# Patient Record
Sex: Female | Born: 1935 | Race: White | Hispanic: No | Marital: Single | Smoking: Never smoker
Health system: Southern US, Community
[De-identification: ages and names within clinical notes are randomized; demographics above are authoritative.]

## PROBLEM LIST (undated history)

## (undated) DIAGNOSIS — C4490 Unspecified malignant neoplasm of skin, unspecified: Secondary | ICD-10-CM

## (undated) DIAGNOSIS — F319 Bipolar disorder, unspecified: Secondary | ICD-10-CM

## (undated) DIAGNOSIS — R1310 Dysphagia, unspecified: Secondary | ICD-10-CM

## (undated) DIAGNOSIS — H9190 Unspecified hearing loss, unspecified ear: Secondary | ICD-10-CM

## (undated) DIAGNOSIS — F32A Depression, unspecified: Secondary | ICD-10-CM

## (undated) DIAGNOSIS — F329 Major depressive disorder, single episode, unspecified: Secondary | ICD-10-CM

## (undated) DIAGNOSIS — I2510 Atherosclerotic heart disease of native coronary artery without angina pectoris: Secondary | ICD-10-CM

## (undated) DIAGNOSIS — K219 Gastro-esophageal reflux disease without esophagitis: Secondary | ICD-10-CM

## (undated) DIAGNOSIS — I10 Essential (primary) hypertension: Secondary | ICD-10-CM

## (undated) DIAGNOSIS — A0472 Enterocolitis due to Clostridium difficile, not specified as recurrent: Secondary | ICD-10-CM

## (undated) DIAGNOSIS — I639 Cerebral infarction, unspecified: Secondary | ICD-10-CM

## (undated) DIAGNOSIS — E7800 Pure hypercholesterolemia, unspecified: Secondary | ICD-10-CM

## (undated) DIAGNOSIS — N289 Disorder of kidney and ureter, unspecified: Secondary | ICD-10-CM

---

## 2005-10-29 ENCOUNTER — Inpatient Hospital Stay (HOSPITAL_COMMUNITY): Admission: EM | Admit: 2005-10-29 | Discharge: 2005-11-03 | Payer: Self-pay | Admitting: Emergency Medicine

## 2005-10-30 ENCOUNTER — Encounter (INDEPENDENT_AMBULATORY_CARE_PROVIDER_SITE_OTHER): Payer: Self-pay | Admitting: Interventional Cardiology

## 2005-10-30 ENCOUNTER — Encounter: Payer: Self-pay | Admitting: Vascular Surgery

## 2007-04-25 ENCOUNTER — Encounter: Admission: RE | Admit: 2007-04-25 | Discharge: 2007-04-25 | Payer: Self-pay | Admitting: Gastroenterology

## 2008-09-08 ENCOUNTER — Encounter: Admission: RE | Admit: 2008-09-08 | Discharge: 2008-09-08 | Payer: Self-pay | Admitting: Internal Medicine

## 2010-09-30 NOTE — Discharge Summary (Signed)
[name], [name]                  ACCOUNT NO.:  [id number]   MEDICAL RECORD NO.:  [id number]          Haas TYPE:  INP   LOCATION:  4731                         FACILITY:  [facility]   PHYSICIAN:  Lonia Blood, M.D.       DATE OF BIRTH:  10/18/1923   DATE OF ADMISSION:  10/29/2005  DATE OF DISCHARGE:  11/01/2005                                 DISCHARGE SUMMARY   PRIMARY CARE PHYSICIAN:  Unassigned.   DISCHARGE DIAGNOSES:  1.  Acute mental status changes of unclear etiology.  2.  Lacunar cerebrovascular accident.  3.  Hypertension.  4.  Hyperlipidemia.  5.  Extensive atherosclerotic disease of Victoria cerebrovascular system.  6.  Bilateral carotid artery stenosis, 80% bilaterally.  7.  Mild anemia.  8.  Mild cognitive deficit.  9.  Gastroesophageal reflux disease.  10. Coronary artery disease status post angioplasty in 1998.   DISCHARGE MEDICATIONS:  1.  Aricept 5 mg by mouth at bedtime.  2.  Remeron 15 mg by mouth at bedtime.  3.  Aspirin 325 mg by mouth daily.  4.  Zyprexa 2.5 mg at bedtime.  5.  Diovan 80 mg by mouth daily.  6.  Zocor 40 mg by mouth at bedtime.  7.  Prilosec OTC 20 mg by mouth daily.  8.  Foltx 1 tablet daily.   CONDITION AT DISCHARGE:  [name] in good condition.   At Victoria time of discharge, she was alert, oriented, contributing to her care.  She was Haas under Victoria care of her family who has been pursuing short-  term assisted living placement.  [name]'s family was given instructions  about how to contact [facility] office if [name] wishes  to establish care in [place], [place].  [name] is from  [place], [place].   PROCEDURES DURING THIS ADMISSION:  1.  [name] had an MRI of Victoria brain that showed a subacute lacunar CVA.  2.  MRA of Victoria brain that showed extensive atherosclerotic disease.  3.  Transthoracic echocardiogram which showed mild increased thickness and      45%  ejection fraction.  4.  Carotid ultrasound that showed 80% bilateral ICA stenoses.  5.  EEG that showed borderline slow alpha but no seizure activity.   CONSULTATIONS DURING THIS ADMISSION:  [name] was seen in consultation  by Dr. Jeanie Sewer from psychiatry and Dr. Anne Hahn from neurology.   HISTORY AND PHYSICAL:  For admission history and physical, refer to Victoria  dictated H&P done by Dr. Darnelle Catalan on 10/29/05.   HOSPITAL COURSE:  1.  Altered mental status.  [name] was admitted with a workup diagnosis of      delirium.  She had extensive laboratory work including cultures,      complete metabolic profile, complete blood count.  There was no cause      for delirium that we could identify.  [name]'s mental status      cleared remarkably and by hospital day 2, she was alert and oriented,  able to feed herself and cooperating with care.  Victoria history gathered      from Victoria family raised a possibility of a psychiatric illness, so Victoria      Haas was seen in consultation by Dr. Antonietta Breach from psychiatry.      Victoria psychiatry service deemed that [name] may have insidious onset      dementia with a component of a mood disorder, not otherwise specified.      [name] is currently on Aricept, Remeron and Zyprexa and she appears      symptomatically okay.  2.  Lacunar CVA.  [name] had complete workup.  It was felt that her CVA      was related to her hypertensive disease and hyperlipidemia.  We have      proceeded with aggressive risk factor modification, blood pressure      control, started [name] on aspirin, started [name] on a      statin.  Also, [name] was started on folic acid daily.  3.  Hyperlipidemia.  [name] was started on Zocor while here in Victoria      hospital.  4.  Gastroesophageal reflux disease.  [name] PPI was  continued while      she was in Victoria hospital.      Lonia Blood, M.D.  Electronically Signed     SL/MEDQ  D:  11/01/2005  T:   11/01/2005  Job:  [id number]   cc:   Marlan Palau, M.D.  Fax: [phone]

## 2010-09-30 NOTE — H&P (Signed)
NAMECARLISA, EBLE                  ACCOUNT NO.:  [id number]   MEDICAL RECORD NO.:  [id number]          PATIENT TYPE:  EMS   LOCATION:  MAJO                         FACILITY:  [facility]   PHYSICIAN:  Hillery Aldo, M.D.   DATE OF BIRTH:  [birth year]   DATE OF ADMISSION:  10/29/2005  DATE OF DISCHARGE:                                HISTORY & PHYSICAL   PRIMARY CARE PHYSICIAN:  The patient is unassigned.   CHIEF COMPLAINT:  Agitation with altered mental status.   HISTORY OF PRESENT ILLNESS:  The patient is a 77 year old female who was in  her usual state of health, up until approximately May when she got up in the  middle of the night sustaining a fall.  At that time, she was living with  her son, caring for him (the patient's son as bipolar).  Reportedly, she was  brought to the emergency department; and was found to have hypertension.  The patient has had had a history of hypertension in the past, but stopped  all medications several years ago.  She was hospitalized for 5 days, at that  time, and started on Diovan for blood pressure control and Prilosec for  esophagitis.  The esophagitis had been precipitated by swallowing a calcium  tablet that lodged in her esophagus.   Approximately 1 week after discharge, she was seen in follow-up and found to  have systolic blood pressures in the 160s-170s; and, therefore, Norvasc was  added to her antihypertensive regimen.  She did well for 1 week; and was  living with her son and daughter-in-law who were supervising her; and they  noted some issues with her memory and comprehension intermittently.  She was  not unsteady on her feet.  Occasionally she was lethargic, and did not  engage in her usual activities.  She gradually improved to the point where  they felt comfortable sending her back home to live with her other son; and  apparently she did well for approximately 5 days.  She then developed some  restlessness and agitation; and, therefore,  her son and daughter-in-law came  and took her to an outside hospital for evaluation.  According to them,  there was no obvious hallucinations, but she was agitated and violent,  throwing pillows; at times seemingly to recognize them, and at other times  not able to recognize them.  She would intermittently lay on the floor in a  fetal position.  When evaluated in the outside hospital, she was given  Ativan 1 mg x2 which dropped her blood pressure to the 70s/80s range.  She  was originally admitted to an outside hospital neural ICU for blood pressure  stabilization; and was subsequently discharged on 10/28/2005.   On discharge, she was given a prescription for Aricept 10 mg which she had  not yet taken; and Remeron 15 mg which she took last p.m. Family feels  unable to adequately care for her given her restlessness and agitation; and  they brought her in to the emergency department, here, for evaluation.  Notably, at the outside hospital she did have  an extensive neurological  evaluation including a CT scan of the head followed by an MRI scan.  CT scan  of the head did not show any acute infarct, bleed, or mass.  She did have  evidence of old small vessel ischemic changes.  The MRI showed remote left  deep white matter lacunar infarct in the __________ ovale.  There was a left  pronounced, lacunar ischemic change in the right deep white matter.  There  was profound deep white matter change, present, without an acute component.  There were also remote ischemic changes in the left thalamus and pons.  There was no mass or midline shift.  No hemorrhage or hydrocephalus.  Cerebellar tonsils were normal in position.  She is being admitted, today,  for further neuropsychiatric evaluation and workup along with stabilization.   PAST MEDICAL HISTORY:  1.  Mild dementia.  2.  Hypertension.  3.  Gastroesophageal reflux disease and esophagitis, status post esophageal      dilatation.  4.   Cerebrovascular disease by MRI.  5.  Coronary artery disease status post angioplasty in 1998.  6.  History of hyperlipidemia.  7.  History of skin cancer removal.  8.  History of unspecified psychiatric condition requiring hospitalization      back in the 60s.  This apparently was prompted by a pregnancy in one of      her children that was at a blood __________.   FAMILY HISTORY:  The patient's parental history is unknown as she was raised  by a foster family.  She has some siblings that have suffered with cancer.  She has three offspring.  One has bipolar disorder, one suffers with MS, and  was one is healthy, and one deceased offspring, secondary to spinal bifida.   SOCIAL HISTORY:  The patient is widowed x10 years.  Up until her most recent  illness, she lived with her bipolar son whom she cared for.  She is a  lifelong nonsmoker.  No alcohol.  No drugs.  She was a homemaker.   DRUG ALLERGIES:  None.   MEDICATIONS:  1.  Diovan 320 mg daily.  2.  Prilosec 20 mg daily.  3.  Aricept 10 mg q.h.s.; note, the patient has not had taken a dose of      this.  4.  Remeron 15 mg q.h.s. (the patient took 1 dose of this last p.m. only).   Note, the patient did not take a daily aspirin.   REVIEW OF SYSTEMS:  The patient denies any fever or chills.  No nausea or  vomiting.  No headache.  When asked about chest pain; she stated that she  had some earlier, but not now.  According to her family, her appetite is  okay when she is not agitated.  The patient is not cooperative with  answering all questions.  She is clearly upset and agitated.   PHYSICAL EXAM:  VITAL SIGNS:  Temperature 97.3, pulse 96, respirations 20,  blood pressure 157/82.  GENERAL:  A restless elderly female who is not cooperative with the history  and physical exam.  HEENT:  Normocephalic, atraumatic.  Pupils appear to be normal; however she  will clamp her eyes down tight when I attempt to examine them with a light. When  she is interactive, her extraocular movements appear to be intact, but  she refuses to allow formal testing.  She does not have any obvious facial  weakness.  NECK:  Supple, no thyromegaly, no  lymphadenopathy, no jugular venous  distension.  CHEST:  Lungs clear to auscultation bilaterally with fair air movement.  Noncooperative with instructions to take deep breaths.  HEART:  Regular rate, and rhythm.  No murmurs, rubs or gallops.  ABDOMEN:  Soft, nontender, nondistended with normoactive bowel sounds.  GENITOURINARY:  The patient has a Foley catheter in place draining yellow  urine.  EXTREMITIES:  She has some xerosis, but there is no clubbing, edema, or  cyanosis.  SKIN:  Warm and dry.  No rashes.  NEUROLOGIC:  The patient is restless.  She is disoriented.  She will not,  tell me her name or where she is.  She does not know the date or the  president.  She is able to recognize her son and tell me his name.  Object  naming is intact for a watch and a pen.  She appears to move all extremities  x4 spontaneously.  The family reports that her gait is unsteady.  She is not  cooperative with a full neurologic testing.   DATA REVIEW:  White blood cell count 4.6, hemoglobin 10.8, hematocrit 31,  platelet count 152, absolute neutrophil count 3.1, MCV is 93.2.  Sodium 140,  potassium 3.3, chloride 108, bicarb 22, BUN 14, creatinine 0.9, glucose 95,  calcium 9.5, total protein 6.2, albumin 3.7, AST 21, ALT 21, alkaline  phosphatase 51, and total bilirubin is 1.5.   ASSESSMENT AND PLAN:  1.  Delirium with underlying dementia,  The patient has already had a      workup.  Thyroid function was found to be normal.  RPR was tested though      the results are not available.  She had CT scanning and MRI scanning      which indicate underlying cerebrovascular disease, but none of these      appeared to be acute.  She does not have any focal neurologic deficits      on exam to suggest an acute neurologic  event.  The most likely      explanation for this is some underlying depression in the setting of      dementia.  Given this, we will obtain a psychiatric evaluation in the      morning.  She will also need a formal neurologic evaluation by a      neurologist.  Given her cerebrovascular disease, I will initiate a      stroke workup including a repeat MRI to see if there are any significant      changes or new findings from her previous.  Will check a 2-D      echocardiogram and carotid Dopplers as well as a homocystine level and      fasting lipid panel.  Remotely, infection cause acute delirium; however,      the patient's white blood cell count is not elevated and she is      afebrile.  Nonetheless, I will obtain blood cultures and a urine      culture.  2.  Coronary artery disease.  The patient has not been on aspirin or any      other therapy for her history of coronary artery disease.  Will check an      EKG and a chest x-ray along with 2-D echocardiogram.  Will cycle enzymes     q.8 h. x3.  3.  Agitation.  We will use Haldol as needed for restlessness and agitation.  4.  Hypertension.  Will continue the patient's  Diovan and Norvasc for now.  5.  Prophylaxis.  Will initiate GI and DVT prophylaxis.      Hillery Aldo, M.D.     CR/MEDQ  D:  10/29/2005  T:  10/30/2005  Job:  [id number]

## 2010-09-30 NOTE — Consult Note (Signed)
[name], [name]                  ACCOUNT NO.:  [id number]   MEDICAL RECORD NO.:  [id number]          PATIENT TYPE:  INP   LOCATION:  4731                         FACILITY:  [facility]   PHYSICIAN:  Antonietta Breach, M.D.  DATE OF BIRTH:  1924/04/06   DATE OF CONSULTATION:  10/31/2005  DATE OF DISCHARGE:  11/03/2005                                   CONSULTATION   REASON FOR CONSULTATION:  Psychosis and agitation.   REQUESTING PHYSICIAN:  Hillery Aldo, MD.   HISTORY OF PRESENT ILLNESS:  [name] is a 77 year old female admitted on  June 17 to the [facility] with acute mental status changes.  Recently she had been hospitalized in May after falling at home. She has  been compliant with her hypertensive medication repeatedly over the past 2  months and this has required medical attention. The family has reported some  memory trouble. The patient has also displayed severe agitation. Her memory  has progressed to not recognizing family members at times. Neurological  workup has included CT, MRI and TSH. The patient has insomnia and paranoia.  There are no thoughts of harming herself or thoughts of harming others.   PAST PSYCHIATRIC HISTORY:  In the past 1960s, the patient had some sort of  acute episode of mental symptoms and was apparently psychiatrically  hospitalized. There is no known history of psychotropic medications.   FAMILY PSYCHIATRIC HISTORY:  There is report that a son has bipolar  disorder.   GENERAL MEDICAL HISTORY:  Rule out dementia, hypertension.   MEDICATIONS:  The medication administration record is reviewed.  Psychotropics include:  1.  Aricept 5 mg q.h.s.  2.  Remeron 15 mg q.h.s.   No known drug allergies.   SOCIAL HISTORY:  The patient has been living with her son who has been  acting as her caregiver. Marital:  Widowed x10 years. Alcohol none. Illegal  drugs none. Occupation, retired.   REVIEW OF SYSTEMS:  CONSTITUTIONAL:  No fever. HEAD:   No trauma. EYES:  No  visual changes. ENT:  No sore throat. RESPIRATORY:  No cough.  CARDIOVASCULAR:  The patient does have a history of CAD reported in the  records. GASTROINTESTINAL:  There is also reported in the record a history  of gastroesophageal reflux disease. GENITOURINARY:  No dysuria.  MUSCULOSKELETAL:  No deformities, weaknesses or atrophy.  HEMATOLOGIC/LYMPHATIC:  There is a mild anemia with a hemoglobin 10.8.  NEUROLOGIC:  The patient has a history of cerebrovascular disease with CVA  reported by MRI. PSYCHIATRIC:  As above. ENDOCRINE:  Unremarkable.  METABOLIC:  The patient did have a urine drug screen positive for  benzodiazepines. B12, folic acid, RPR and TSH are pending.   PHYSICAL EXAMINATION:  VITAL SIGNS:  Temperature 97.6, pulse 94,  respirations 18, blood pressure 149/80. Room air O2 saturation 96%.  MENTAL STATUS:  [name] is an Elderly female lying in a left lateral  decubitus position with an inappropriate smile. She appears to be attending  to internal stimuli. Her eye contact is fair, her speech is slightly anxious  without increased speed. On thought content, she denies any stress, she  denies any difficulty at all. As mentioned, she appears to have some  internal stimulation. There are no thoughts of harming herself, no thoughts  of harming others. On memory exam, she is a poor historian with  contradictory information. Recall testing 3/3 immediate, 1/3 at 2 minutes.  She does not know her address in [place]. On orientation, she is oriented  to person and place, thinks the year is 75 and does get the month correct.  Later on, she corrects herself and states that the year is 2007.   Judgment is impaired, insight is poor. Fund of knowledge and intelligence  appear to be less than her premorbid baseline.   ASSESSMENT:  AXIS I:  Unspecified persistent mental disorder not otherwise  specified, 294.9. Rule out dementia.  1.  Psychotic disorder not  otherwise specified.  2.  Mood disorder not otherwise specified.  AXIS II:  None.  AXIS III:  See general medical problems above.  AXIS IV:  General medical.  AXIS V:  30. The patient has deficits in memory and judgment. She does not  have the capacity for informed consent.   RECOMMENDATIONS:  1.  Zyprexa 2.5 mg p.o. q.h.s. for agitation and psychosis.  2.  We will follow while in the general medical hospital.      Antonietta Breach, M.D.  Electronically Signed     JW/MEDQ  D:  11/05/2005  T:  11/06/2005  Job:  [id number]

## 2010-09-30 NOTE — Consult Note (Signed)
[name], [name]                  ACCOUNT NO.:  [id number]   MEDICAL RECORD NO.:  [id number]          PATIENT TYPE:  INP   LOCATION:  4731                         FACILITY:  [facility]   PHYSICIAN:  Marlan Palau, M.D.  DATE OF BIRTH:  07-03-1923   DATE OF CONSULTATION:  10/30/2005  DATE OF DISCHARGE:                                   CONSULTATION   HISTORY OF PRESENT ILLNESS:  [name] is a 77 year old right-handed white  female born January 05, 2004, with a history of multi-infarct state  demonstrated previously on MRI study of the brain.  Patient has a mild  organic brain syndrome and was placed on Aricept but has not yet taken the  medication prior to this admission.  Patient was noted to have some problems  with blood pressure, gait instability.  Patient was hospitalized I believe  at [facility] on October 26, 2005, with altered mental status.  Patient had been noted to be agitated, confused, combative.  Patient, at one  point, was noted to curl up on the floor.  Normally, patient  had been  fairly functional and independent.  Patient underwent a CT scan of the head  and eventually an MRI study that showed evidence of extensive small vessel  ischemic changes, no acute changes seen.  Blood work revealed a normal B12  level and ammonia level and TSH.  Patient was transferred to [facility] for further evaluation.   PAST MEDICAL HISTORY:  1.  History of new onset confusion, delirium state.  2.  Organic brain syndrome.  3.  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  4.  Hypertension.  5.  Gastroesophageal reflux disease.  6.  Esophageal dilatation.  7.  Coronary artery disease status post angioplasty.  8.  Hyperlipidemia.  9.  History of skin cancer of some sort in the past.   Patient does not smoke or drink.   NO KNOWN DRUG ALLERGIES.   CURRENT MEDICATIONS:  1.  Norvasc 10 mg daily.  2.  Aspirin 325 mg a day.  3.  Lovenox 40 mg subcu daily.  4.   Protonix 40 mg daily.  5.  Potassium 20 mEq daily.  6.  Diovan 320 mg daily.  7.  Tylenol if needed.  8.  Haldol 5 mg if needed.  9.  Zofran if needed.  10. Phenergan if needed.   SOCIAL HISTORY:  This patient lives in [place], [place].  Patient is widowed, has three children; one with multiple sclerosis, one  with bipolar disorder.   FAMILY HISTORY:  Both parents passed away unknown cause.  Patient has six  brothers and sisters, all but one sister has passed away.  Most of her  siblings have died with cancer.   REVIEW OF SYSTEMS:  Notable for no fever or chills.  Patient denies  headache, neck pain, denies shortness of breath, chest pain, abdominal pain,  nausea or vomiting.  Does have some change in balance, slight staggering  problems.  Some trouble sleeping.  Patient recalls being somewhat angry and  agitated but does not know why.   PHYSICAL EXAMINATION:  GENERAL APPEARANCE:  This patient is a thin, elderly  white female who is alert and cooperative at the time of examination.  VITAL SIGNS:  Blood pressure 146/80, heart rate 87, respiratory rate 18,  temperature afebrile.  HEENT:  Head is atraumatic.  Eyes:  Pupils are round and reactive to light.  Discs are flat bilaterally.  NECK:  Supple.  No carotid bruits noted.  RESPIRATORY:  Clear.  CARDIOVASCULAR:  Regular rate and rhythm with no obvious murmurs or rubs  noted.  EXTREMITIES:  Without significant edema.  NEUROLOGIC:  Cranial nerves as above.  Facial symmetry is relatively intact.  Some depression of the right nasal labial fold may be present.  Extraocular  movements are full.  Visual fields are full.  Speech is fairly well  enunciated, not aphasic.  Motor test reveals 5/5 strength in all fours.  Good symmetric motor tone is noted throughout.  Sensory testing is intact to  pinprick, soft touch and vibratory sensation throughout.  Patient has fair  finger-nose-finger and toe-to-finger.  Gait was not  tested.  Deep tendon  reflexes are symmetric and normal.  No drift is seen.   LABORATORY DATA:  White count of 4.6, hemoglobin 10.8, hematocrit 31, MCV  93.2, platelets 152.  Sodium 140, potassium 3.3, chloride 108, CO2 22,  glucose 95, BUN 14, creatinine 0.9.  Alkaline phosphatase 1.5, SGOT 51, SGPT  21, total protein 6.2, albumin 3.7, calcium 9.5.  CK 166, MB fraction 3,  troponin I 0.02.  Cholesterol 181, triglycerides 78, LDL 43, VLDL 16, LDL  122.  Urine drug screen positive for benzodiazepines, otherwise negative.  Urinalysis reveals specific gravity 1.014, pH 6.5, ketones 15 mg/dl.   IMPRESSION:  1.  Episode of agitation, confusion most consistent with delirium state,      etiology unknown.  2.  Multi-infarct state.  3.  Mild organic brain syndrome.   This patient appears to be at or near her baseline.  Currently, patient is  oriented to person, place and date.  Patient remembers two of three words at  five minutes, has significant difficulty spelling the word world backwards  and could not perform serial 7's.  The patient's event of agitation is most  consistent with delirium state.  Cannot rule out psychotic depression but  the duration may be too brief for this diagnosis.  Certainly medication such  as Aricept, particularly when it started at a 10 mg dose, may cause  psychosis but patient had not taken this medication around the time of the  above event.  It is not clear that any medication was added or subtracted  around the time of the onset of the delirium state that would be likely to  cause the delirium.  Delirium state is usually related to metabolic or toxic  cause.   PLAN:  1.  MRI of the brain and MR angiogram ordered and pending.  2.  A 2-D echocardiogram  and carotid Doppler study are pending.  3.  Will check an EEG study.  4.  Check RPR, sed rate, ANA in a.m.   Will follow patient's clinical course while in house.      Marlan Palau, M.D.   Electronically Signed     CKW/MEDQ  D:  10/30/2005  T:  10/31/2005  Job:  [id number]   cc:   [facility]  [address]  [address]

## 2010-09-30 NOTE — Consult Note (Signed)
NAMELUWANNA, BROSSMAN                  ACCOUNT NO.:  [id number]   MEDICAL RECORD NO.:  [id number]          PATIENT TYPE:  INP   LOCATION:  4731                         FACILITY:  [facility]   PHYSICIAN:  Antonietta Breach, M.D.  DATE OF BIRTH:  1924-04-01   DATE OF CONSULTATION:  11/02/2005  DATE OF DISCHARGE:  11/03/2005                                   CONSULTATION   HISTORY OF PRESENT ILLNESS:  Mrs. Seiple appears to be worse in her psychosis.  She is guarded, has poor eye contact, appears to be attending to things  visually that are not there.  She has no adverse psychotropic effect.  She  has poverty of speech and will frequently pull the covers up to avoid  interview.  She has not been taking her medication today and has not been  eating.   PHYSICAL EXAMINATION:  Her thought process involves some perseveration,  states get away from me.  Her affect is catastrophically anxious and  guarded.  Her judgment is impaired.  Her insight is poor.  She clearly has  paranoia.   ASSESSMENT:  Psychotic disorder, not otherwise specified.  The patient is at  risk for passive lethal self neglect due to this psychosis that is impairing  her judgment.   RECOMMENDATIONS:  1.  Inpatient psychiatric unit for further evaluation and treatment.  2.  Continue he Zyprexa trial at 2.5 mg p.o. four IM q.h.s. and anticipate      increasing this dosage as tolerated by 2.5 mg per day to approximately      10 mg daily as the trial dose.  Any forced antipsychotic regimen in a      patient that is not acutely physically destructive towards self or      others and hospitalized will require the opinion of two physicians      documented in the chart.      Antonietta Breach, M.D.  Electronically Signed     JW/MEDQ  D:  11/05/2005  T:  11/06/2005  Job:  [id number]

## 2010-09-30 NOTE — Procedures (Signed)
EEG number is [phone].   REQUESTING PHYSICIAN:  Marlan Palau, M.D.   This is a 77 year old woman with a history of dementia being evaluated for  delirium.  This was a 17 channel EEG with 1 channel devoted to EKG utilizing  the international 10-20 lead placement system.  The patient was described as  being awake clinically and she is also awake electrographically.  The  background consists of well organized, well developed, well modulated 7 to 8  hertz alpha which is predominant in the posterior head regions and reactive  to eye opening.  No clear interhemispheric asymmetry is identified and no  definite epileptiform discharges are seen.  Photic stimulation produced  occipital photic driving at most flash frequencies.  Hyperventilation was  not performed.  EKG monitor reveals a relatively regular rhythm with a rate  of 102 beats per minute.   CONCLUSION:  Borderline EEG for a slow alpha at 7 to 8 hertz range.  In  some elderly populations a 7 hertz would be considered an acceptable alpha.  No seizure activity or focal abnormalities are seen during the course of  today's recording.  Clinical correlation is recommended.      Catherine A. Orlin Hilding, M.D.  Electronically Signed     [id number]  D:  10/31/2005 13:31:22  T:  10/31/2005 20:05:56  Job #:  [id number]

## 2010-09-30 NOTE — Discharge Summary (Signed)
Victoria, Haas                  ACCOUNT NO.:  [id number]   MEDICAL RECORD NO.:  [id number]          PATIENT TYPE:  INP   LOCATION:  4731                         FACILITY:  [facility]   PHYSICIAN:  Lonia Blood, M.D.       DATE OF BIRTH:  1924/04/05   DATE OF ADMISSION:  10/29/2005  DATE OF DISCHARGE:  11/03/2005                                 DISCHARGE SUMMARY   PRIMARY CARE PHYSICIAN:  Unassigned.   DISCHARGE DIAGNOSES:  For complete list of discharge diagnoses, please refer  to the previously dictated discharge summary done on November 01, 2005, with  addition of acute psychotic episode of unclear etiology with refusal of  hydration, refusal of food and agitation.   DISCHARGE MEDICATIONS:  Please delete from the list Remeron 15 mg by mouth  at bedtime.  Change Diovan dose to 160 mg by mouth daily.  Add Ativan 0.5 mg  IV q.6h. p.r.n. for severe agitation.   CONDITION ON DISCHARGE:  The discharge plans that were in place for November 01, 2005, were not possible to be realized because [name] did not leave [facility].  The reason the patient did not leave [facility] was  because assisted-living was not ready to take the patient.  On November 02, 2005, when the assisted-living was ready to take the patient, the patient  developed an acute psychotic breakdown and she was not able to be  transferred there.   DISPOSITION:  At this point in time, the new plans for disposition are to  transfer [name] via an ambulance to an inpatient psychiatric facility.   The list of consultations and procedures remain unchanged.   HISTORY OF PRESENT ILLNESS:  This is unchanged.   HOSPITAL COURSE:  Hospital course from June 17, until June 20, remains  unchanged.  For hospital course covering June 21, until June 22, please  refer to the currently dictated discharge summary.  On November 02, 2005, when  [name] was ready to be discharged, she became extremely agitated, curled  up in bed and  refusing to speak with the staff or receive care.  She was  assessed immediately by Dr. Antonietta Breach from psychiatry and it was  thought that she had an acute psychotic episode and that this patient is at  risk for passive and active self-destruction and neglect due to her impaired  judgment.  We have discussed this extensively with the patient's family and  we could not find any medical reasons for the patient's acute change in  mental status.  The patient's vital signs, temperature, complete  metabolic profile, complete blood count have remained unchanged.  [name]  has refused care starting November 02, 2005, without providing [place] with a good  reason why she is doing that.  On November 03, 2005, [name] will be  transferred to an acute care psychiatric hospital in [place].      Lonia Blood, M.D.  Electronically Signed     SL/MEDQ  D:  11/03/2005  T:  11/03/2005  Job:  [id number]   cc:   [facility]

## 2011-06-06 DIAGNOSIS — F332 Major depressive disorder, recurrent severe without psychotic features: Secondary | ICD-10-CM | POA: Diagnosis not present

## 2011-10-05 DIAGNOSIS — I10 Essential (primary) hypertension: Secondary | ICD-10-CM | POA: Diagnosis not present

## 2011-10-05 DIAGNOSIS — R350 Frequency of micturition: Secondary | ICD-10-CM | POA: Diagnosis not present

## 2012-02-16 DIAGNOSIS — Z961 Presence of intraocular lens: Secondary | ICD-10-CM | POA: Diagnosis not present

## 2012-04-09 DIAGNOSIS — F411 Generalized anxiety disorder: Secondary | ICD-10-CM | POA: Diagnosis not present

## 2012-04-09 DIAGNOSIS — I10 Essential (primary) hypertension: Secondary | ICD-10-CM | POA: Diagnosis not present

## 2012-04-09 DIAGNOSIS — K219 Gastro-esophageal reflux disease without esophagitis: Secondary | ICD-10-CM | POA: Diagnosis not present

## 2012-04-09 DIAGNOSIS — Z23 Encounter for immunization: Secondary | ICD-10-CM | POA: Diagnosis not present

## 2012-05-22 DIAGNOSIS — F332 Major depressive disorder, recurrent severe without psychotic features: Secondary | ICD-10-CM | POA: Diagnosis not present

## 2012-06-07 DIAGNOSIS — H903 Sensorineural hearing loss, bilateral: Secondary | ICD-10-CM | POA: Diagnosis not present

## 2012-10-03 DIAGNOSIS — F411 Generalized anxiety disorder: Secondary | ICD-10-CM | POA: Diagnosis not present

## 2012-10-03 DIAGNOSIS — K219 Gastro-esophageal reflux disease without esophagitis: Secondary | ICD-10-CM | POA: Diagnosis not present

## 2012-10-03 DIAGNOSIS — I10 Essential (primary) hypertension: Secondary | ICD-10-CM | POA: Diagnosis not present

## 2013-03-22 DIAGNOSIS — Z23 Encounter for immunization: Secondary | ICD-10-CM | POA: Diagnosis not present

## 2013-04-15 DIAGNOSIS — Z1331 Encounter for screening for depression: Secondary | ICD-10-CM | POA: Diagnosis not present

## 2013-04-15 DIAGNOSIS — G459 Transient cerebral ischemic attack, unspecified: Secondary | ICD-10-CM | POA: Diagnosis not present

## 2013-04-15 DIAGNOSIS — I10 Essential (primary) hypertension: Secondary | ICD-10-CM | POA: Diagnosis not present

## 2013-04-15 DIAGNOSIS — I6529 Occlusion and stenosis of unspecified carotid artery: Secondary | ICD-10-CM | POA: Diagnosis not present

## 2013-04-16 ENCOUNTER — Other Ambulatory Visit: Payer: Self-pay | Admitting: Internal Medicine

## 2013-04-16 ENCOUNTER — Other Ambulatory Visit: Payer: Self-pay

## 2013-04-16 ENCOUNTER — Ambulatory Visit
Admission: RE | Admit: 2013-04-16 | Discharge: 2013-04-16 | Disposition: A | Discharge status: Home / Self Care | Payer: Medicare Other | Source: Ambulatory Visit | Attending: Internal Medicine | Admitting: Internal Medicine

## 2013-04-16 DIAGNOSIS — G459 Transient cerebral ischemic attack, unspecified: Secondary | ICD-10-CM

## 2013-04-16 DIAGNOSIS — I6350 Cerebral infarction due to unspecified occlusion or stenosis of unspecified cerebral artery: Secondary | ICD-10-CM | POA: Diagnosis not present

## 2013-04-16 DIAGNOSIS — I671 Cerebral aneurysm, nonruptured: Secondary | ICD-10-CM | POA: Diagnosis not present

## 2013-04-17 ENCOUNTER — Ambulatory Visit
Admission: RE | Admit: 2013-04-17 | Discharge: 2013-04-17 | Disposition: A | Discharge status: Home / Self Care | Payer: Medicare Other | Source: Ambulatory Visit | Attending: Internal Medicine | Admitting: Internal Medicine

## 2013-04-17 DIAGNOSIS — G459 Transient cerebral ischemic attack, unspecified: Secondary | ICD-10-CM

## 2013-04-17 DIAGNOSIS — I658 Occlusion and stenosis of other precerebral arteries: Secondary | ICD-10-CM | POA: Diagnosis not present

## 2013-04-17 DIAGNOSIS — I69991 Dysphagia following unspecified cerebrovascular disease: Secondary | ICD-10-CM | POA: Diagnosis not present

## 2013-04-17 DIAGNOSIS — I69939 Monoplegia of upper limb following unspecified cerebrovascular disease affecting unspecified side: Secondary | ICD-10-CM | POA: Diagnosis not present

## 2013-04-17 DIAGNOSIS — I129 Hypertensive chronic kidney disease with stage 1 through stage 4 chronic kidney disease, or unspecified chronic kidney disease: Secondary | ICD-10-CM | POA: Diagnosis not present

## 2013-04-17 DIAGNOSIS — I4891 Unspecified atrial fibrillation: Secondary | ICD-10-CM | POA: Diagnosis not present

## 2013-04-22 ENCOUNTER — Inpatient Hospital Stay (HOSPITAL_COMMUNITY)
Admission: EM | Admit: 2013-04-22 | Discharge: 2013-04-24 | DRG: 065 | Disposition: A | Discharge status: Home Health | Payer: Medicare Other | Attending: Internal Medicine | Admitting: Internal Medicine

## 2013-04-22 ENCOUNTER — Encounter (HOSPITAL_COMMUNITY): Payer: Self-pay | Admitting: Emergency Medicine

## 2013-04-22 ENCOUNTER — Emergency Department (HOSPITAL_COMMUNITY): Payer: Medicare Other

## 2013-04-22 DIAGNOSIS — R29810 Facial weakness: Secondary | ICD-10-CM | POA: Diagnosis present

## 2013-04-22 DIAGNOSIS — I671 Cerebral aneurysm, nonruptured: Secondary | ICD-10-CM | POA: Diagnosis not present

## 2013-04-22 DIAGNOSIS — N183 Chronic kidney disease, stage 3 unspecified: Secondary | ICD-10-CM | POA: Diagnosis present

## 2013-04-22 DIAGNOSIS — F411 Generalized anxiety disorder: Secondary | ICD-10-CM | POA: Diagnosis present

## 2013-04-22 DIAGNOSIS — R209 Unspecified disturbances of skin sensation: Secondary | ICD-10-CM | POA: Diagnosis not present

## 2013-04-22 DIAGNOSIS — Z8673 Personal history of transient ischemic attack (TIA), and cerebral infarction without residual deficits: Secondary | ICD-10-CM | POA: Diagnosis not present

## 2013-04-22 DIAGNOSIS — G8190 Hemiplegia, unspecified affecting unspecified side: Secondary | ICD-10-CM | POA: Diagnosis present

## 2013-04-22 DIAGNOSIS — I672 Cerebral atherosclerosis: Secondary | ICD-10-CM | POA: Diagnosis not present

## 2013-04-22 DIAGNOSIS — N189 Chronic kidney disease, unspecified: Secondary | ICD-10-CM | POA: Diagnosis present

## 2013-04-22 DIAGNOSIS — E785 Hyperlipidemia, unspecified: Secondary | ICD-10-CM | POA: Diagnosis present

## 2013-04-22 DIAGNOSIS — Z7902 Long term (current) use of antithrombotics/antiplatelets: Secondary | ICD-10-CM

## 2013-04-22 DIAGNOSIS — F319 Bipolar disorder, unspecified: Secondary | ICD-10-CM | POA: Diagnosis present

## 2013-04-22 DIAGNOSIS — I6350 Cerebral infarction due to unspecified occlusion or stenosis of unspecified cerebral artery: Secondary | ICD-10-CM | POA: Diagnosis not present

## 2013-04-22 DIAGNOSIS — K219 Gastro-esophageal reflux disease without esophagitis: Secondary | ICD-10-CM | POA: Diagnosis present

## 2013-04-22 DIAGNOSIS — I4891 Unspecified atrial fibrillation: Secondary | ICD-10-CM | POA: Diagnosis present

## 2013-04-22 DIAGNOSIS — I10 Essential (primary) hypertension: Secondary | ICD-10-CM | POA: Diagnosis not present

## 2013-04-22 DIAGNOSIS — I129 Hypertensive chronic kidney disease with stage 1 through stage 4 chronic kidney disease, or unspecified chronic kidney disease: Secondary | ICD-10-CM | POA: Diagnosis present

## 2013-04-22 DIAGNOSIS — I6340 Cerebral infarction due to embolism of unspecified cerebral artery: Secondary | ICD-10-CM | POA: Diagnosis present

## 2013-04-22 DIAGNOSIS — I517 Cardiomegaly: Secondary | ICD-10-CM | POA: Diagnosis not present

## 2013-04-22 DIAGNOSIS — R4789 Other speech disturbances: Secondary | ICD-10-CM | POA: Diagnosis present

## 2013-04-22 DIAGNOSIS — Z79899 Other long term (current) drug therapy: Secondary | ICD-10-CM

## 2013-04-22 DIAGNOSIS — R29818 Other symptoms and signs involving the nervous system: Secondary | ICD-10-CM | POA: Diagnosis not present

## 2013-04-22 DIAGNOSIS — I639 Cerebral infarction, unspecified: Secondary | ICD-10-CM | POA: Diagnosis present

## 2013-04-22 DIAGNOSIS — R29898 Other symptoms and signs involving the musculoskeletal system: Secondary | ICD-10-CM | POA: Diagnosis not present

## 2013-04-22 HISTORY — DX: Essential (primary) hypertension: I10

## 2013-04-22 HISTORY — DX: Pure hypercholesterolemia, unspecified: E78.00

## 2013-04-22 HISTORY — DX: Cerebral infarction, unspecified: I63.9

## 2013-04-22 HISTORY — DX: Bipolar disorder, unspecified: F31.9

## 2013-04-22 HISTORY — DX: Depression, unspecified: F32.A

## 2013-04-22 HISTORY — DX: Major depressive disorder, single episode, unspecified: F32.9

## 2013-04-22 LAB — CBC
HCT: 33.2 % — ABNORMAL LOW (ref 36.0–46.0)
Hemoglobin: 11.2 g/dL — ABNORMAL LOW (ref 12.0–15.0)
MCHC: 33.7 g/dL (ref 30.0–36.0)
MCV: 90.2 fL (ref 78.0–100.0)
Platelets: ADEQUATE 10*3/uL (ref 150–400)

## 2013-04-22 LAB — DIFFERENTIAL
Basophils Relative: 0 % (ref 0–1)
Eosinophils Relative: 2 % (ref 0–5)
Neutrophils Relative %: 67 % (ref 43–77)
Smear Review: ADEQUATE

## 2013-04-22 LAB — LIPID PANEL
LDL Cholesterol: 117 mg/dL — ABNORMAL HIGH (ref 0–99)
Total CHOL/HDL Ratio: 4.3 RATIO
Triglycerides: 116 mg/dL (ref <150)

## 2013-04-22 LAB — GLUCOSE, CAPILLARY: Glucose-Capillary: 157 mg/dL — ABNORMAL HIGH (ref 70–99)

## 2013-04-22 LAB — COMPREHENSIVE METABOLIC PANEL
Alkaline Phosphatase: 82 U/L (ref 39–117)
CO2: 21 mEq/L (ref 19–32)
Calcium: 9.9 mg/dL (ref 8.4–10.5)
Chloride: 102 mEq/L (ref 96–112)
Creatinine, Ser: 1.07 mg/dL (ref 0.50–1.10)
GFR calc non Af Amer: 45 mL/min — ABNORMAL LOW (ref >90)
Potassium: 4.3 mEq/L (ref 3.5–5.1)
Sodium: 135 mEq/L (ref 135–145)
Total Protein: 7.8 g/dL (ref 6.0–8.3)

## 2013-04-22 LAB — TROPONIN I: Troponin I: <0.3 ng/mL (ref <0.30)

## 2013-04-22 LAB — POCT I-STAT TROPONIN I: Troponin i, poc: 0 ng/mL (ref 0.00–0.08)

## 2013-04-22 LAB — HEMOGLOBIN A1C
Hgb A1c MFr Bld: 5.6 % (ref <5.7)
Mean Plasma Glucose: 114 mg/dL (ref <117)

## 2013-04-22 LAB — PROTIME-INR: Prothrombin Time: 12.8 seconds (ref 11.6–15.2)

## 2013-04-22 MED ORDER — PANTOPRAZOLE SODIUM 40 MG PO TBEC
40.0000 mg | DELAYED_RELEASE_TABLET | Freq: Every day | ORAL | Status: DC
  Administered 2013-04-23 – 2013-04-24 (×2): 40 mg via ORAL
  Filled 2013-04-22 (×2): qty 1

## 2013-04-22 MED ORDER — QUETIAPINE FUMARATE 50 MG PO TABS
50.0000 mg | ORAL_TABLET | Freq: Every day | ORAL | Status: DC
  Administered 2013-04-23: 50 mg via ORAL
  Filled 2013-04-22 (×3): qty 1

## 2013-04-22 MED ORDER — SODIUM CHLORIDE 0.9 % IV SOLN
INTRAVENOUS | Status: DC
  Administered 2013-04-22 – 2013-04-23 (×2): via INTRAVENOUS

## 2013-04-22 MED ORDER — ATORVASTATIN CALCIUM 10 MG PO TABS
10.0000 mg | ORAL_TABLET | Freq: Every day | ORAL | Status: DC
  Administered 2013-04-23 – 2013-04-24 (×2): 10 mg via ORAL
  Filled 2013-04-22 (×2): qty 1

## 2013-04-22 MED ORDER — ONDANSETRON HCL 4 MG/2ML IJ SOLN: 4.0000 mg | Freq: Three times a day (TID) | INTRAMUSCULAR | Status: AC | PRN

## 2013-04-22 MED ORDER — DOCUSATE SODIUM 50 MG PO CAPS
50.0000 mg | ORAL_CAPSULE | Freq: Every day | ORAL | Status: DC
  Administered 2013-04-23 – 2013-04-24 (×2): 50 mg via ORAL
  Filled 2013-04-22 (×2): qty 1

## 2013-04-22 MED ORDER — ENOXAPARIN SODIUM 40 MG/0.4ML ~~LOC~~ SOLN
40.0000 mg | SUBCUTANEOUS | Status: DC
  Administered 2013-04-22 – 2013-04-23 (×2): 40 mg via SUBCUTANEOUS
  Filled 2013-04-22 (×3): qty 0.4

## 2013-04-22 MED ORDER — SENNOSIDES-DOCUSATE SODIUM 8.6-50 MG PO TABS: 1.0000 | ORAL_TABLET | Freq: Every evening | ORAL | Status: DC | PRN

## 2013-04-22 MED ORDER — ASPIRIN 300 MG RE SUPP
300.0000 mg | Freq: Every day | RECTAL | Status: DC
  Administered 2013-04-22: 300 mg via RECTAL
  Filled 2013-04-22 (×2): qty 1

## 2013-04-22 MED ORDER — CLOPIDOGREL BISULFATE 75 MG PO TABS
75.0000 mg | ORAL_TABLET | Freq: Every day | ORAL | Status: DC
  Administered 2013-04-23 – 2013-04-24 (×2): 75 mg via ORAL
  Filled 2013-04-22 (×3): qty 1

## 2013-04-22 MED ORDER — LORAZEPAM 0.5 MG PO TABS: 0.2500 mg | ORAL_TABLET | Freq: Every day | ORAL | Status: DC | PRN

## 2013-04-22 MED ORDER — SERTRALINE HCL 100 MG PO TABS
100.0000 mg | ORAL_TABLET | Freq: Every day | ORAL | Status: DC
  Administered 2013-04-23 – 2013-04-24 (×2): 100 mg via ORAL
  Filled 2013-04-22 (×2): qty 1

## 2013-04-22 MED ORDER — ASPIRIN 325 MG PO TABS
325.0000 mg | ORAL_TABLET | Freq: Every day | ORAL | Status: DC
  Administered 2013-04-23: 325 mg via ORAL
  Filled 2013-04-22: qty 1

## 2013-04-22 MED ORDER — ACETAMINOPHEN 650 MG RE SUPP: 650.0000 mg | RECTAL | Status: DC | PRN

## 2013-04-22 MED ORDER — HYDRALAZINE HCL 20 MG/ML IJ SOLN: 10.0000 mg | Freq: Four times a day (QID) | INTRAMUSCULAR | Status: DC | PRN

## 2013-04-22 MED ORDER — ACETAMINOPHEN 325 MG PO TABS: 650.0000 mg | ORAL_TABLET | ORAL | Status: DC | PRN

## 2013-04-22 NOTE — H&P (Signed)
History and Physical       Hospital Admission Note Date: 04/22/2013  Patient name: [name] Medical record number: [id number] Date of birth: March 01, 1924 Age: 79 y.o. Gender: female PCP: Lillia Mountain, MD    Chief Complaint:  Slurred speech with left hand weakness, paresthesias  HPI: Patient is 79 year old female with history of hypertension, hyperlipidemia, previous CVA, bipolar disorder presented to ER with above complaints. History was mostly obtained from the patient's multiple family members in the room. Per the family, patient originally had slurred speech and left hand clumsiness and numbness on Monday 12/1. The symptoms resolved on the same day however returned back on Tuesday, 12/2 the next morning. The patient was seen by PCP on 04/16/2013, outpatient MRI was done which showed right punctate frontal infarcts. Per family, Patient used to be on aspirin 81 mg daily, she was placed on Plavix 75mg  daily by the PCP. This a.m., her daughter noted that she had increased slurred speech, left facial drooping and left arm paresthesias, weakness in the left hand. Also patient's daughter, noticed that patient was not able to swallow applesauce this morning. At the time of my examination, the patient has minimal symptoms, still has slight weakness in the left hand, numbness in her fingers. Her family her symptoms have significant improved from the time of presentation. Neurology was consulted and tried hospice was requested for admission. She was not considered a TPA candidate due to recent CVA.  Review of Systems:  Constitutional: Denies fever, chills, diaphoresis, poor appetite and fatigue.  HEENT: Denies photophobia, eye pain, redness, hearing loss, ear pain, congestion, sore throat, rhinorrhea, sneezing, mouth sores, trouble swallowing, neck pain, neck stiffness and tinnitus.   Respiratory: Denies SOB, DOE, cough, chest tightness,  and  wheezing.   Cardiovascular: Denies chest pain, palpitations and leg swelling.  Gastrointestinal: Denies nausea, vomiting, abdominal pain, diarrhea, constipation, blood in stool and abdominal distention.  Genitourinary: Denies dysuria, urgency, frequency, hematuria, flank pain and difficulty urinating.  Musculoskeletal: Denies myalgias, back pain, joint swelling, arthralgias and gait problem.  Skin: Denies pallor, rash and wound.  Neurological: Please see history of present illness Hematological: Denies adenopathy. Easy bruising, personal or family bleeding history  Psychiatric/Behavioral: Denies suicidal ideation, mood changes, confusion, nervousness, sleep disturbance and agitation  Past Medical History: Past Medical History  Diagnosis Date  . Stroke   . Bipolar disorder   . Hypertension   . Depression   . Hypercholesteremia    History reviewed. No pertinent past surgical history.  Medications: Prior to Admission medications   Medication Sig Start Date End Date Taking? Authorizing Provider  Amlodipine-Olmesartan (AZOR PO) Take 40 mg by mouth daily. Pt must have brand   Yes Historical Provider, MD  atorvastatin (LIPITOR) 10 MG tablet Take 10 mg by mouth daily.   Yes Historical Provider, MD  clopidogrel (PLAVIX) 75 MG tablet Take 75 mg by mouth daily with breakfast.   Yes Historical Provider, MD  docusate sodium (COLACE) 50 MG capsule Take 50 mg by mouth daily.   Yes Historical Provider, MD  LORazepam (ATIVAN) 0.5 MG tablet Take 0.25 mg by mouth daily as needed for anxiety (pt take a 1/4 of a 0.5 tablet).   Yes Historical Provider, MD  Multiple Vitamins-Minerals (MULTI COMPLETE PO) Take 1 tablet by mouth daily.   Yes Historical Provider, MD  Omeprazole (PRILOSEC PO) Take 1 tablet by mouth 2 (two) times daily.   Yes Historical Provider, MD  QUEtiapine (SEROQUEL) 50 MG tablet Take 50 mg by  mouth at bedtime. Pt can only use brand name drug   Yes Historical Provider, MD  sertraline  (ZOLOFT) 100 MG tablet Take 100 mg by mouth daily.   Yes Historical Provider, MD    Allergies:  No Known Allergies  Social History:  reports that she has never smoked. She does not have any smokeless tobacco history on file. She reports that she does not drink alcohol or use illicit drugs.  Family History: History reviewed. No pertinent family history.  Physical Exam: Blood pressure 165/88, pulse 80, temperature 97.4 F (36.3 C), temperature source Oral, resp. rate 20, SpO2 96.00%. General: Alert, awake, oriented x3, in no acute distress, no dysarthria. HEENT: normocephalic, atraumatic, anicteric sclera, pink conjunctiva, pupils equal and reactive to light and accomodation, oropharynx clear Neck: supple, no masses or lymphadenopathy, no goiter, no bruits  Heart: Regular rate and rhythm, without murmurs, rubs or gallops. Lungs: Clear to auscultation bilaterally, no wheezing, rales or rhonchi. Abdomen: Soft, nontender, nondistended, positive bowel sounds, no masses. Extremities: No clubbing, cyanosis or edema with positive pedal pulses. Neuro: Grossly intact, no focal neurological deficits, very mild left facial drooping, strength 5/5 in upper and lower extremities bilaterally, left hand grip slightly weaker Psych: alert and oriented x 3, normal mood and affect Skin: no rashes or lesions, warm and dry   LABS on Admission:  Basic Metabolic Panel:  Recent Labs Lab 04/22/13 1235  NA 135  K 4.3  CL 102  CO2 21  GLUCOSE 92  BUN 23  CREATININE 1.07  CALCIUM 9.9   Liver Function Tests:  Recent Labs Lab 04/22/13 1235  AST 24  ALT 19  ALKPHOS 82  BILITOT 0.4  PROT 7.8  ALBUMIN 4.0   No results found for this basename: LIPASE, AMYLASE,  in the last 168 hours No results found for this basename: AMMONIA,  in the last 168 hours CBC:  Recent Labs Lab 04/22/13 1235  WBC 6.5  NEUTROABS 4.3  HGB 11.2*  HCT 33.2*  MCV 90.2  PLT PLATELETS APPEAR ADEQUATE   Cardiac  Enzymes:  Recent Labs Lab 04/22/13 1235  TROPONINI <0.30   BNP: No components found with this basename: POCBNP,  CBG:  Recent Labs Lab 04/22/13 1220  GLUCAP 157*     Radiological Exams on Admission: Ct Head (brain) Wo Contrast  04/22/2013   CLINICAL DATA:  Slurred speech, left arm weakness  EXAM: CT HEAD WITHOUT CONTRAST  TECHNIQUE: Contiguous axial images were obtained from the base of the skull through the vertex without intravenous contrast.  COMPARISON:  None.  FINDINGS: There is diffuse cortical atrophy as well as areas of low attenuation within the subcortical, deep, and periventricular white matter regions. A focal area of low attenuation projects just posterior to the caudate nucleus on the left. Status the appearance of a subacute to chronic region of the colon or infarction. There is no evidence of mass effect. The ventricles and cisterns are patent. An ill-defined area of low attenuation projects along the medial periphery of the right cerebellar hemisphere suspicious for a region of chronic lacunar infarction image number 7 series 2. The osseous structures demonstrate no evidence of a depressed skull fracture. The visualized paranasal sinuses and mastoid air cells are patent.  IMPRESSION: Chronic and involutional changes without evidence of acute abnormalities.   Electronically Signed   By: Salome Holmes M.D.   On: 04/22/2013 12:31   Mr Maxine Glenn Head Wo Contrast  04/16/2013   CLINICAL DATA:  Episodes of left hand  numbness and weakness. Dizziness.  EXAM: MRI HEAD WITHOUT CONTRAST  MRA HEAD WITHOUT CONTRAST  TECHNIQUE: Multiplanar, multiecho pulse sequences of the brain and surrounding structures were obtained without intravenous contrast. Angiographic images of the head were obtained using MRA technique without contrast.  COMPARISON:  MRI and MRA brain 10/30/2005  FINDINGS: MRI HEAD FINDINGS  Two punctate areas of acute nonhemorrhagic infarct are present in the anterior right frontal  lobe on images 36 and 37 of the diffusion sequence. Remote lacunar infarcts of the basal ganglia and corona radiata are similar to the prior exam. Extensive periventricular and subcortical white matter disease is similar as well with some progression in the subcortical disease. Flow is present in the major intracranial arteries. The patient is status post bilateral lens extractions. The ventricles are proportionate to the degree of atrophy. No significant extra-axial fluid collection is present.  There is some distortion from dental artifact. The visualized paranasal sinuses and mastoid air cells are clear.  MRA HEAD FINDINGS  The cervical left ICA is tortuous. Mild atherosclerotic changes are present within the cavernous carotid arteries bilaterally. Prominent posterior communicating arteries are present bilaterally. Moderate stenosis of the proximal left A1 segment is again seen. A 2 mm anterior communicating artery aneurysm is present. Mild irregularity is present in the M1 segments bilaterally. The bifurcations are intact. There is a high-grade stenosis and near occlusion of the anterior right M2 branch. There is progressive attenuation of the inferior right M2 branch. Moderate small vessel changes on the left have progressed as well. The ACA branch vessels demonstrate progressive disease is well.  The distal left describes that the left vertebral artery is not seen. There is signal loss in the distal right vertebral artery. The basilar artery is moderately attenuated, as before. Both posterior cerebral arteries are of fetal type. The basilar artery essentially terminates at the superior cerebellar arteries. Moderate branch vessel disease is similar to the prior exam.  IMPRESSION: 1. Two punctate areas of acute nonhemorrhagic infarct in the anterior right frontal lobe. 2. Stable appearance of remote lacunar infarcts in the basal ganglia and extensive periventricular white matter disease. 3. Slight progression of  scattered subcortical white matter disease, likely related to small vessel ischemic changes. 4. 2 mm anterior communicating artery aneurysm. 5. Progressive MCA stenoses as described, right greater than left. 6. 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 7. Moderate small vessel disease.   Electronically Signed   By: Gennette Pac M.D.   On: 04/16/2013 20:54   Mr Brain Wo Contrast  04/16/2013   CLINICAL DATA:  Episodes of left hand numbness and weakness. Dizziness.  EXAM: MRI HEAD WITHOUT CONTRAST  MRA HEAD WITHOUT CONTRAST  TECHNIQUE: Multiplanar, multiecho pulse sequences of the brain and surrounding structures were obtained without intravenous contrast. Angiographic images of the head were obtained using MRA technique without contrast.  COMPARISON:  MRI and MRA brain 10/30/2005  FINDINGS: MRI HEAD FINDINGS  Two punctate areas of acute nonhemorrhagic infarct are present in the anterior right frontal lobe on images 36 and 37 of the diffusion sequence. Remote lacunar infarcts of the basal ganglia and corona radiata are similar to the prior exam. Extensive periventricular and subcortical white matter disease is similar as well with some progression in the subcortical disease. Flow is present in the major intracranial arteries. The patient is status post bilateral lens extractions. The ventricles are proportionate to the degree of atrophy. No significant extra-axial fluid collection is present.  There is some  distortion from dental artifact. The visualized paranasal sinuses and mastoid air cells are clear.  MRA HEAD FINDINGS  The cervical left ICA is tortuous. Mild atherosclerotic changes are present within the cavernous carotid arteries bilaterally. Prominent posterior communicating arteries are present bilaterally. Moderate stenosis of the proximal left A1 segment is again seen. A 2 mm anterior communicating artery aneurysm is present. Mild irregularity is present in the M1  segments bilaterally. The bifurcations are intact. There is a high-grade stenosis and near occlusion of the anterior right M2 branch. There is progressive attenuation of the inferior right M2 branch. Moderate small vessel changes on the left have progressed as well. The ACA branch vessels demonstrate progressive disease is well.  The distal left describes that the left vertebral artery is not seen. There is signal loss in the distal right vertebral artery. The basilar artery is moderately attenuated, as before. Both posterior cerebral arteries are of fetal type. The basilar artery essentially terminates at the superior cerebellar arteries. Moderate branch vessel disease is similar to the prior exam.  IMPRESSION: 1. Two punctate areas of acute nonhemorrhagic infarct in the anterior right frontal lobe. 2. Stable appearance of remote lacunar infarcts in the basal ganglia and extensive periventricular white matter disease. 3. Slight progression of scattered subcortical white matter disease, likely related to small vessel ischemic changes. 4. 2 mm anterior communicating artery aneurysm. 5. Progressive MCA stenoses as described, right greater than left. 6. 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 7. Moderate small vessel disease.   Electronically Signed   By: Gennette Pac M.D.   On: 04/16/2013 20:54   US Carotid Duplex Bilateral  04/17/2013   CLINICAL DATA:  Right frontal infarcts. Hypertension. Blurred vision, dizziness.  EXAM: BILATERAL CAROTID DUPLEX ULTRASOUND  TECHNIQUE: Wallace Cullens scale imaging, color Doppler and duplex ultrasound was performed of bilateral carotid and vertebral arteries in the neck.  COMPARISON:  None.  REVIEW OF SYSTEMS: Quantification of carotid stenosis is based on velocity parameters that correlate the residual internal carotid diameter with NASCET-based stenosis levels, using the diameter of the distal internal carotid lumen as the denominator for  stenosis measurement.  The following velocity measurements were obtained:  PEAK SYSTOLIC/END DIASTOLIC  RIGHT  ICA:                     112/23cm/sec  CCA:                     153/38cm/sec  SYSTOLIC ICA/CCA RATIO:  0.73  DIASTOLIC ICA/CCA RATIO: 0.61  ECA:                     102cm/sec  LEFT  ICA:                     92/22cm/sec  CCA:                     109/16cm/sec  SYSTOLIC ICA/CCA RATIO:  0.85  DIASTOLIC ICA/CCA RATIO: 1.38  ECA:                     115cm/sec  FINDINGS: RIGHT CAROTID ARTERY: Eccentric plaque in the distal common carotid artery and bulb, partially calcified, result in at least mild narrowing of the lumen. There is mild plaque in the proximal ICA. No high-grade stenosis. Normal waveforms and color Doppler signal.  RIGHT VERTEBRAL ARTERY:  Normal flow direction and waveform.  LEFT CAROTID  ARTERY: Circumferential partially calcified plaque in the distal common carotid artery and bulb. There is smooth noncalcified plaque at the external carotid origin and proximal ICA. No high-grade stenosis. Normal waveforms and color Doppler signal.  LEFT VERTEBRAL ARTERY: Normal flow direction and waveform.  IMPRESSION: 1. Bilateral carotid bifurcation and proximal ICA plaque, resulting in less than 50% diameter stenosis. The exam does not exclude plaque ulceration or embolization. Continued surveillance recommended.   Electronically Signed   By: Oley Balm M.D.   On: 04/17/2013 11:09    Assessment/Plan Principal Problem:   CVA (cerebral infarction)- Outpatient MRI was positive for right frontal infarct, similar symptoms again today - Will admit for stroke workup, patient has been seen by neurology and undergone MRI/MRA brain results still pending - Per neurology, she was just recently changed from aspirin to Plavix, this would not be considered a Plavix failure - Patient and family had discussed POINT trial with neurology and at this time, they do not want to pursue the trial, hence will continue  Plavix. - Obtain 2-D echocardiogram, PT, OT, speech eval, swallow evaluation - Carotid Dopplers were recently done on 04/17/2013, showed a less than 50% stenosis bilaterally  Active Problems:   Hypertension - Placed on PRN hydralazine with parameters, swallow eval still pending    Hyperlipidemia - Continue Lipitor  Anxiety with bipolar disorder - Continue Ativan, Seroquel, Zoloft  DVT prophylaxis: Lovenox  CODE STATUS: Full code  Family Communication: Admission, patients condition and plan of care including tests being ordered have been discussed with the patient and family who indicates understanding and agree with the plan and Code Status   Further plan will depend as patient's clinical course evolves and further radiologic and laboratory data become available.   Time Spent on Admission: 1 hour  Lilliane Sposito M.D. [facility] 04/22/2013, 3:41 PM Pager: [phone]  If 7PM-7AM, please contact night-coverage [email or web address] Password [email or web address]

## 2013-04-22 NOTE — ED Notes (Signed)
Pt from home arriving to ED at 1141. Per daughter, pt awoke at 0900 this morning at baseline. Pt given a bath. Between 0900 and 1000 pt took medications in applesauce and started to drool. Daughter notes left hand grip weakness, slurred speech. EDP made aware of stroke symptoms at 1219, Code stroke called at 1222,stroke team arrival at 1232, neurologist arrived at 1230, phlebotomist arrived at 1223, CT read at 13. Pt AO x4.

## 2013-04-22 NOTE — ED Provider Notes (Signed)
CSN: [id number]     Arrival date & time 04/22/13  1131 History   First MD Initiated Contact with Patient 04/22/13 1204     Chief Complaint  Patient presents with  . Weakness   (Consider location/radiation/quality/duration/timing/severity/associated sxs/prior Treatment) HPI Comments: Pt with hx of recent stroke last week, HTN, HL comes in with cc of new difficulty with swallowing and slurred speech. Pt is last normal at 9 am. Between 9:30 and 10, pt was in the shower, and started having slurred speech and difficulty swallowing and new left sided weakness. Pt comes to the ED with family. No falls.  Patient is a 79 y.o. female presenting with weakness. The history is provided by the patient and a relative.  Weakness Pertinent negatives include no chest pain, no abdominal pain and no shortness of breath.    Past Medical History  Diagnosis Date  . Stroke   . Bipolar disorder   . Hypertension   . Depression   . Hypercholesteremia    History reviewed. No pertinent past surgical history. History reviewed. No pertinent family history. History  Substance Use Topics  . Smoking status: Never Smoker   . Smokeless tobacco: Not on file  . Alcohol Use: No   OB History   Grav Para Term Preterm Abortions TAB SAB Ect Mult Living                 Review of Systems  Constitutional: Positive for activity change.  HENT: Negative for facial swelling.   Respiratory: Negative for cough, shortness of breath and wheezing.   Cardiovascular: Negative for chest pain.  Gastrointestinal: Negative for nausea, vomiting, abdominal pain, diarrhea, constipation, blood in stool and abdominal distention.  Genitourinary: Negative for hematuria and difficulty urinating.  Musculoskeletal: Negative for neck pain.  Skin: Negative for color change.  Neurological: Positive for facial asymmetry, weakness and numbness. Negative for dizziness, tremors, syncope and speech difficulty.  Hematological: Does not bruise/bleed  easily.  Psychiatric/Behavioral: Negative for confusion.    Allergies  Review of patient's allergies indicates no known allergies.  Home Medications   Current Outpatient Rx  Name  Route  Sig  Dispense  Refill  . Amlodipine-Olmesartan (AZOR PO)   Oral   Take 40 mg by mouth daily. Pt must have brand         . atorvastatin (LIPITOR) 10 MG tablet   Oral   Take 10 mg by mouth daily.         . clopidogrel (PLAVIX) 75 MG tablet   Oral   Take 75 mg by mouth daily with breakfast.         . docusate sodium (COLACE) 50 MG capsule   Oral   Take 50 mg by mouth daily.         Marland Kitchen LORazepam (ATIVAN) 0.5 MG tablet   Oral   Take 0.25 mg by mouth daily as needed for anxiety (pt take a 1/4 of a 0.5 tablet).         . Multiple Vitamins-Minerals (MULTI COMPLETE PO)   Oral   Take 1 tablet by mouth daily.         . Omeprazole (PRILOSEC PO)   Oral   Take 1 tablet by mouth 2 (two) times daily.         . QUEtiapine (SEROQUEL) 50 MG tablet   Oral   Take 50 mg by mouth at bedtime. Pt can only use brand name drug         . sertraline (  ZOLOFT) 100 MG tablet   Oral   Take 100 mg by mouth daily.          BP 165/88  Pulse 80  Temp(Src) 97.4 F (36.3 C) (Oral)  Resp 20  SpO2 96% Physical Exam  Nursing note and vitals reviewed. Constitutional: She is oriented to person, place, and time. She appears well-developed and well-nourished.  HENT:  Head: Normocephalic and atraumatic.  Eyes: EOM are normal. Pupils are equal, round, and reactive to light.  Neck: Neck supple.  Cardiovascular: Normal rate, regular rhythm and normal heart sounds.   No murmur heard. Pulmonary/Chest: Effort normal. No respiratory distress.  Abdominal: Soft. She exhibits no distension. There is no tenderness. There is no rebound and no guarding.  Neurological: She is alert and oriented to person, place, and time.  + dysarthria, + weak left hand grip and subjective numbness.  Skin: Skin is warm and  dry.    ED Course  Procedures (including critical care time) Labs Review Labs Reviewed  CBC - Abnormal; Notable for the following:    RBC 3.68 (*)    Hemoglobin 11.2 (*)    HCT 33.2 (*)    All other components within normal limits  COMPREHENSIVE METABOLIC PANEL - Abnormal; Notable for the following:    GFR calc non Af Amer 45 (*)    GFR calc Af Amer 53 (*)    All other components within normal limits  GLUCOSE, CAPILLARY - Abnormal; Notable for the following:    Glucose-Capillary 157 (*)    All other components within normal limits  LIPID PANEL - Abnormal; Notable for the following:    LDL Cholesterol 117 (*)    All other components within normal limits  PROTIME-INR  APTT  DIFFERENTIAL  TROPONIN I  HEMOGLOBIN A1C  POCT I-STAT TROPONIN I   Imaging Review Ct Head (brain) Wo Contrast  04/22/2013   CLINICAL DATA:  Slurred speech, left arm weakness  EXAM: CT HEAD WITHOUT CONTRAST  TECHNIQUE: Contiguous axial images were obtained from the base of the skull through the vertex without intravenous contrast.  COMPARISON:  None.  FINDINGS: There is diffuse cortical atrophy as well as areas of low attenuation within the subcortical, deep, and periventricular white matter regions. A focal area of low attenuation projects just posterior to the caudate nucleus on the left. Status the appearance of a subacute to chronic region of the colon or infarction. There is no evidence of mass effect. The ventricles and cisterns are patent. An ill-defined area of low attenuation projects along the medial periphery of the right cerebellar hemisphere suspicious for a region of chronic lacunar infarction image number 7 series 2. The osseous structures demonstrate no evidence of a depressed skull fracture. The visualized paranasal sinuses and mastoid air cells are patent.  IMPRESSION: Chronic and involutional changes without evidence of acute abnormalities.   Electronically Signed   By: Salome Holmes M.D.   On:  04/22/2013 12:31    EKG Interpretation   None       MDM   1. Acute ischemic stroke    DDx includes:  Stroke - ischemic vs. hemorrhagic TIA Neuropathy Myelitis Electrolyte abnormality Neuropathy Muscular disease  Pt with recent stroke (left sided weakness), comes in with new weakness, left sided, slurred speech and dysphagia. Concerns for stroke. Stroke pager activated. With recent stroke and her age - TPA not given.  Pt to be admitted.   Derwood Kaplan, MD 04/22/13 1538

## 2013-04-22 NOTE — ED Notes (Signed)
Pt returned from MRI. Md Rai paged. Pt reports improved numbness to left fingers. Improved speech and facial droop.

## 2013-04-22 NOTE — ED Notes (Signed)
Pt from home with family c/o difficulty swallowing pills today and left hand weakness; pt had similar last week x several episodes and has small CVA noted on outpt CT; pt with mild left sided grip strength weakness and slurred speech at present; pt LSN last night; pt with hx of old CVA in past with some resdiual

## 2013-04-22 NOTE — Progress Notes (Signed)
Pt arrived on unit about 1648 via bed with family at bedside. Pt A&O x4, pt denies any pain, NIH 3, c/o slight numbness to Left fingers and slight left facial droop. Slight slurred speech noted but family reports pt has plates in teeth and contributes to pt speech. Pt placed on Tele; MAE x4; pt neuro checks completed, pt placed on NPO d/t failing swallow screen. family at bedside and will continue to monitor quietly.

## 2013-04-22 NOTE — Consult Note (Signed)
Referring Physician: Rhunette Croft    Chief Complaint: Stroke  HPI:                                                                                                                                         [name] is an 79 y.o. female  Who was recently seen by PCP 04/16/13 for slurred speech and found to have a right frontal infarcts (puncatate) on MRI .  Patient was discharged home for stroke workup to be done as a out patient .  Per noted she did have Carotid dopplers which were negative. This AM her daughter noted she had increased slurred speech and increased left facial droop. On arrival patient described a left face and arm paresthesia and mild left facial droop. Currently her exam only shows minimal symptoms of left facial droop and subjective decreased sensation in left arm and leg.   Date last known well: Date: 04/22/2013 Time last known well: Time: 09:00 tPA Given: No: recent CVA NIHSS 3  Past Medical History  Diagnosis Date  . Stroke   . Bipolar disorder   . Hypertension   . Depression   . Hypercholesteremia     History reviewed. No pertinent past surgical history.  History reviewed. No pertinent family history. Social History:  reports that she has never smoked. She does not have any smokeless tobacco history on file. She reports that she does not drink alcohol or use illicit drugs.  Allergies: No Known Allergies  Medications:                                                                                                                           Plavix 75 mg daily  ROS:  History obtained from the patient  General ROS: negative for - chills, fatigue, fever, night sweats, weight gain or weight loss Psychological ROS: negative for - behavioral disorder, hallucinations, memory difficulties, mood swings or suicidal ideation Ophthalmic ROS:  negative for - blurry vision, double vision, eye pain or loss of vision ENT ROS: negative for - epistaxis, nasal discharge, oral lesions, sore throat, tinnitus or vertigo Allergy and Immunology ROS: negative for - hives or itchy/watery eyes Hematological and Lymphatic ROS: negative for - bleeding problems, bruising or swollen lymph nodes Endocrine ROS: negative for - galactorrhea, hair pattern changes, polydipsia/polyuria or temperature intolerance Respiratory ROS: negative for - cough, hemoptysis, shortness of breath or wheezing Cardiovascular ROS: negative for - chest pain, dyspnea on exertion, edema or irregular heartbeat Gastrointestinal ROS: negative for - abdominal pain, diarrhea, hematemesis, nausea/vomiting or stool incontinence Genito-Urinary ROS: negative for - dysuria, hematuria, incontinence or urinary frequency/urgency Musculoskeletal ROS: negative for - joint swelling or muscular weakness Neurological ROS: as noted in HPI Dermatological ROS: negative for rash and skin lesion changes  Neurologic Examination:                                                                                                      Blood pressure 165/79, pulse 89, temperature 97.4 F (36.3 C), temperature source Oral, resp. rate 18, SpO2 98.00%.   Mental Status: Alert, oriented, thought content appropriate.  Speech fluent without evidence of aphasia.  Able to follow 3 step commands without difficulty. Cranial Nerves: II: Discs flat bilaterally; Visual fields grossly normal, pupils equal, round, reactive to light and accommodation III,IV, VI: ptosis not present, extra-ocular motions intact bilaterally V,VII: smile asymmetric on the left, facial light touch sensation decreased on the left lower face VIII: hearing normal bilaterally IX,X: gag reflex present XI: bilateral shoulder shrug XII: midline tongue extension without atrophy or fasciculations  Motor: Right : Upper extremity   5/5    Left:      Upper extremity   5/5  Lower extremity   5/5     Lower extremity   5/5 Tone and bulk:normal tone throughout; no atrophy noted Sensory: Pinprick and light touch decreased on the left arm and leg Deep Tendon Reflexes:  Right: Upper Extremity   Left: Upper extremity   biceps (C-5 to C-6) 2/4   biceps (C-5 to C-6) 2/4 tricep (C7) 2/4    triceps (C7) 2/4 Brachioradialis (C6) 2/4  Brachioradialis (C6) 2/4  Lower Extremity Lower Extremity  quadriceps (L-2 to L-4) 1/4   quadriceps (L-2 to L-4) 1/4 Achilles (S1) 0/4   Achilles (S1) 0/4  Plantars: Mute bilaterally Cerebellar: normal finger-to-nose,  normal heel-to-shin test Gait: not tested due to multiple leads.  CV: pulses palpable throughout    Results for orders placed during the hospital encounter of 04/22/13 (from the past 48 hour(s))  GLUCOSE, CAPILLARY     Status: Abnormal   Collection Time    04/22/13 12:20 PM      Result Value Range   Glucose-Capillary 157 (*) 70 - 99 mg/dL   Ct Head (brain) Wo Contrast  04/22/2013   CLINICAL DATA:  Slurred speech, left arm weakness  EXAM: CT HEAD WITHOUT CONTRAST  TECHNIQUE: Contiguous axial images were obtained from the base of the skull through the vertex without intravenous contrast.  COMPARISON:  None.  FINDINGS: There is diffuse cortical atrophy as well as areas of low attenuation within the subcortical, deep, and periventricular white matter regions. A focal area of low attenuation projects just posterior to the caudate nucleus on the left. Status the appearance of a subacute to chronic region of the colon or infarction. There is no evidence of mass effect. The ventricles and cisterns are patent. An ill-defined area of low attenuation projects along the medial periphery of the right cerebellar hemisphere suspicious for a region of chronic lacunar infarction image number 7 series 2. The osseous structures demonstrate no evidence of a depressed skull fracture. The visualized paranasal sinuses and  mastoid air cells are patent.  IMPRESSION: Chronic and involutional changes without evidence of acute abnormalities.   Electronically Signed   By: Salome Holmes M.D.   On: 04/22/2013 12:31   Stroke Risk Factors - hyperlipidemia and hypertension  Assessment and plan discussed with with attending physician and they are in agreement.    Felicie Morn PA-C [facility] [phone]  04/22/2013, 12:44 PM   Assessment: 79 y.o. female who recently was seen by her PCP for left facial numbness and slurred speech on 04/16/13. MRI was positive for right frontal infarct. Patient returns to ED 6 days later for similiar symptoms.  CT head is negative but cannot rule out additional infarcts.  Patient was recently changed from ASA to Plavix thus would not be considered a Plavix failure.   Dr. Leroy Kennedy has discussed POINT trial with family  Recommend: 1) MRI/MRA head 2) 2 D-echo, FLP, A1c to finish stroke work up 3) Dr. Pearlean Brownie to follow in AM.     Patient seen and examined together with physician assistant and I concur with the assessment and plan.  Wyatt Portela, MD

## 2013-04-22 NOTE — ED Notes (Signed)
Family notes that pt's speech has improved, still has left sided facial droop and weakness/sensation loss in left hand

## 2013-04-23 DIAGNOSIS — N189 Chronic kidney disease, unspecified: Secondary | ICD-10-CM | POA: Diagnosis present

## 2013-04-23 DIAGNOSIS — K219 Gastro-esophageal reflux disease without esophagitis: Secondary | ICD-10-CM | POA: Diagnosis present

## 2013-04-23 DIAGNOSIS — I10 Essential (primary) hypertension: Secondary | ICD-10-CM | POA: Diagnosis not present

## 2013-04-23 DIAGNOSIS — I6350 Cerebral infarction due to unspecified occlusion or stenosis of unspecified cerebral artery: Secondary | ICD-10-CM | POA: Diagnosis not present

## 2013-04-23 DIAGNOSIS — I517 Cardiomegaly: Secondary | ICD-10-CM

## 2013-04-23 LAB — LIPID PANEL
HDL: 36 mg/dL — ABNORMAL LOW (ref >39)
LDL Cholesterol: 106 mg/dL — ABNORMAL HIGH (ref 0–99)
Total CHOL/HDL Ratio: 4.8 RATIO
Triglycerides: 147 mg/dL (ref <150)
VLDL: 29 mg/dL (ref 0–40)

## 2013-04-23 MED ORDER — LORAZEPAM 2 MG/ML IJ SOLN: 0.2500 mg | Freq: Four times a day (QID) | INTRAMUSCULAR | Status: DC | PRN

## 2013-04-23 MED ORDER — ASPIRIN EC 81 MG PO TBEC
81.0000 mg | DELAYED_RELEASE_TABLET | Freq: Every day | ORAL | Status: DC
  Administered 2013-04-24: 81 mg via ORAL
  Filled 2013-04-23: qty 1

## 2013-04-23 MED ORDER — LORAZEPAM 0.5 MG PO TABS: 0.2500 mg | ORAL_TABLET | Freq: Four times a day (QID) | ORAL | Status: DC | PRN

## 2013-04-23 MED ORDER — ATORVASTATIN CALCIUM 10 MG PO TABS
10.0000 mg | ORAL_TABLET | Freq: Every day | ORAL | Status: DC
  Filled 2013-04-23 (×2): qty 1

## 2013-04-23 NOTE — Progress Notes (Signed)
Stroke Team Progress Note  HISTORY [name] is an 79 y.o. female who was recently seen by PCP 04/16/13 for slurred speech and found to have a right frontal infarcts (puncatate) on MRI. Stroke workup to be done as a out patient . Per notes, she did have Carotid dopplers which were negative. This AM 04/22/2013 her daughter noted she had increased slurred speech and increased left facial droop. On arrival patient described a left face and arm paresthesia and mild left facial droop. Currently her exam only shows minimal symptoms of left facial droop and subjective decreased sensation in left arm and leg. NIHSS 3. Patient was not a TPA candidate secondary to stroke within the last 90 days. She was admitted for further evaluation and treatment.  SUBJECTIVE Her son is at the bedside.  Overall she feels her condition is stable. Dr. Pearlean Brownie spoke to her daughter over the telephone.  OBJECTIVE Most recent Vital Signs: Filed Vitals:   04/23/13 0130 04/23/13 0300 04/23/13 0550 04/23/13 0952  BP: 133/51 120/59 138/55 163/61  Pulse: 76 72 71 76  Temp: 97.6 F (36.4 C) 98.4 F (36.9 C) 98.1 F (36.7 C) 98.1 F (36.7 C)  TempSrc: Oral Oral Oral Oral  Resp: 20 18 20 20   Height:      Weight:      SpO2: 95% 95% 97% 97%   CBG (last 3)   Recent Labs  04/22/13 1220  GLUCAP 157*    IV Fluid Intake:   . sodium chloride 75 mL/hr at 04/23/13 0634    MEDICATIONS  . aspirin  300 mg Rectal Daily   Or  . aspirin  325 mg Oral Daily  . atorvastatin  10 mg Oral Daily  . atorvastatin  10 mg Oral q1800  . clopidogrel  75 mg Oral Q breakfast  . docusate sodium  50 mg Oral Daily  . enoxaparin (LOVENOX) injection  40 mg Subcutaneous Q24H  . pantoprazole  40 mg Oral Daily  . QUEtiapine  50 mg Oral QHS  . sertraline  100 mg Oral Daily   PRN:  acetaminophen, acetaminophen, hydrALAZINE, LORazepam, LORazepam, senna-docusate  Diet:  NPO  Activity:   DVT Prophylaxis:  Lovenox 40 mg sq daily   CLINICALLY  SIGNIFICANT STUDIES Basic Metabolic Panel:  Recent Labs Lab 04/22/13 1235  NA 135  K 4.3  CL 102  CO2 21  GLUCOSE 92  BUN 23  CREATININE 1.07  CALCIUM 9.9   Liver Function Tests:  Recent Labs Lab 04/22/13 1235  AST 24  ALT 19  ALKPHOS 82  BILITOT 0.4  PROT 7.8  ALBUMIN 4.0   CBC:  Recent Labs Lab 04/22/13 1235  WBC 6.5  NEUTROABS 4.3  HGB 11.2*  HCT 33.2*  MCV 90.2  PLT PLATELETS APPEAR ADEQUATE   Coagulation:  Recent Labs Lab 04/22/13 1235  LABPROT 12.8  INR 0.98   Cardiac Enzymes:  Recent Labs Lab 04/22/13 1235  TROPONINI <0.30   Urinalysis: No results found for this basename: COLORURINE, APPERANCEUR, LABSPEC, PHURINE, GLUCOSEU, HGBUR, BILIRUBINUR, KETONESUR, PROTEINUR, UROBILINOGEN, NITRITE, LEUKOCYTESUR,  in the last 168 hours Lipid Panel    Component Value Date/Time   CHOL 171 04/23/2013 0525   TRIG 147 04/23/2013 0525   HDL 36* 04/23/2013 0525   CHOLHDL 4.8 04/23/2013 0525   VLDL 29 04/23/2013 0525   LDLCALC 106* 04/23/2013 0525   HgbA1C  Lab Results  Component Value Date   HGBA1C 5.6 04/22/2013    Urine Drug Screen:  No results found for this basename: labopia, cocainscrnur, labbenz, amphetmu, thcu, labbarb    Alcohol Level: No results found for this basename: ETH,  in the last 168 hours   CT of the brain  04/22/2013    Chronic and involutional changes without evidence of acute abnormalities.   MRI of the brain  04/22/2013   Acute infarct in the high right frontal lobe, probable motor strip. Small punctate areas of acute infarct in the right frontal parietal and temporal lobe. This is all in the right MCA distribution.  Moderate to advanced chronic ischemic changes as above.  Severe intracranial atherosclerotic disease.  04/16/2013 punctate right frontal infarcts  MRA of the brain  04/22/2013     The posterior division of the right middle cerebral artery shows improved vessel caliber compared with the prior study which may be due to  resolving thrombosis versus technical fractures. The anterior division of the right middle cerebral artery is chronically occluded.  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 It is unclear if this is related to the patient's multiple infarcts on the right, however thrombus in the aneurysm could be embolizing and causing recent infarctions. . 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    2D Echocardiogram    Carotid Doppler  04/17/2013 Bilateral carotid bifurcation and proximal ICA plaque, resulting in less than 50% diameter stenosis. The exam does not exclude plaque ulceration or embolization. Continued surveillance recommended.  CXR    EKG  normal sinus rhythm, Left axis deviation, Inferior  infarct , age undetermined, Anterior infarct , age undetermined, Abnormal ECG.   Therapy Recommendations home health PT  Physical Exam   Frail elderly Caucasian lady.Awake alert. Afebrile. Head is nontraumatic. Neck is supple without bruit. Hearing is normal. Cardiac exam no murmur or gallop. Lungs are clear to auscultation. Distal pulses are well felt. Neurological Exam ;  Awake  Alert oriented x 3. Normal speech and language.eye movements full without nystagmus.fundi were not visualized. Vision acuity and fields appear normal. Hearing is normal. Palatal movements are normal. Face symmetric. Tongue midline. Normal strength except left grip weak and  Diminished fine finger movements on left but normal , tone, reflexes and coordination. Normal sensation. Gait deferred. ASSESSMENT [name] is a 79 y.o. female presenting with increased slurred speech and increased left facial droop in setting of recently known right punctate parietal infarcts. Repeat MRI confirms a new high right frontal lobe infarct with small punctate acute infarct in the right frontal parietal and temporal lobe. infarct. Infarcts felt to be embolic secondary to atrial fibrillation vs R MCA aneurysmal  thrombus.  Recently changed from aspirin to clopidogrel 75 mg orally every day prior to admission. Now on aspirin 325 mg orally every day and clopidogrel 75 mg orally every day for secondary stroke prevention. Patient with resultant left hemiparesis, left facial droop and dysarthria (all improving). Work up underway.  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  2 mm anterior communicating artery aneurysm, unchanged.     Hospital day # 1  TREATMENT/PLAN  Continue dual antiplatelets,  aspirin 81 mg orally every day and clopidogrel 75 mg orally every day for secondary stroke prevention together x 3 months then plavix alone.  F/u 2D echo  Home health PT, RW w/ 5" wheels  Dr. Pearlean Brownie discussed diagnosis, prognosis,  treatment options and plan of care with Dr. Valentina Lucks via telephone. Also spoke with pt, son-in-law in room and daughter via telephone.    Annie Main, MSN, RN, ANVP-BC, ANP-BC, [facility] Pager: [phone] 04/23/2013 10:54 AM  I have personally obtained a history, examined the patient, evaluated imaging results, and formulated the assessment and plan of care. I agree with the above. Delia Heady, MD

## 2013-04-23 NOTE — Progress Notes (Signed)
   CARE MANAGEMENT NOTE 04/23/2013  Patient:  [name], [name]   Account Number:  [id number]  Date Initiated:  04/23/2013  Documentation initiated by:  Jiles Crocker  Subjective/Objective Assessment:   ADMITTED WITH CVA     Action/Plan:   CM FOLLOWING FOR DCP   Anticipated DC Date:  04/28/2013   Anticipated DC Plan:  HOME W HOME HEALTH SERVICES      DC Planning Services  CM consult         Status of service:  In process, will continue to follow Medicare Important Message given?  NA - LOS <3 / Initial given by admissions (If response is "NO", the following Medicare IM given date fields will be blank)  Per UR Regulation:  Reviewed for med. necessity/level of care/duration of stay  Comments:  12/10/2014Abelino Derrick RN,BSN,MHA [phone]

## 2013-04-23 NOTE — Progress Notes (Signed)
  Echocardiogram 2D Echocardiogram has been performed.  Cathie Beams 04/23/2013, 10:33 AM

## 2013-04-23 NOTE — Evaluation (Signed)
Physical Therapy Evaluation Patient Details Name: [name] MRN: [id number] DOB: 10/29/1923 Today's Date: 04/23/2013 Time: 4098-1191 PT Time Calculation (min): 26 min  PT Assessment / Plan / Recommendation History of Present Illness  Pt with hx of recent stroke last week, HTN, HL comes in with cc of new difficulty with swallowing and slurred speech. Pt is last normal at 9 am. Between 9:30 and 10, pt was in the shower, and started having slurred speech and difficulty swallowing and new left sided weakness. MRI reveals new frontal infarct.  Clinical Impression  Patient demonstrates deficits in functional mobility as indicated below, patient will benefit from continued skilled PT to address deficits and maximize function. Patient with good family support available 24 hr. Will need HHPT and hands on assist for mobility upon discharge. Will continue to see and progress activity as tolerated. Recommend use of RW next session.    PT Assessment  Patient needs continued PT services    Follow Up Recommendations  Home health PT;Supervision/Assistance - 24 hour       Barriers to Discharge  Pt has good family support available at all times    Equipment Recommendations  Rolling walker with 5" wheels       Frequency Min 4X/week    Precautions / Restrictions Precautions Precautions: Fall Restrictions Weight Bearing Restrictions: No   Pertinent Vitals/Pain Patient reports no pain at this time      Mobility  Bed Mobility Bed Mobility: Supine to Sit;Sitting - Scoot to Edge of Bed Supine to Sit: 4: Min assist Sitting - Scoot to [place] of Bed: 5: Supervision Details for Bed Mobility Assistance: Assist to elevate trunk to seated position secondary to UE weakness Transfers Transfers: Sit to Stand;Stand to Sit Sit to Stand: 4: Min guard Stand to Sit: 4: Min guard Details for Transfer Assistance: performed x 3; Guard for stability Ambulation/Gait Ambulation/Gait Assistance: 4: Min guard;3:  Mod assist Ambulation Distance (Feet): 180 Feet Assistive device: None Ambulation/Gait Assistance Details: min guard for stability, with occassional LOB requiring increased mod assist to prevent fall. Gait Pattern: Step-through pattern;Decreased stride length;Shuffle;Narrow base of support Gait velocity: decreased General Gait Details: moderate instability with ambulation, may benefit from use of RW        PT Diagnosis: Difficulty walking;Abnormality of gait;Generalized weakness  PT Problem List: Decreased strength;Decreased activity tolerance;Decreased balance;Decreased mobility;Decreased coordination;Decreased knowledge of use of DME PT Treatment Interventions: DME instruction;Gait training;Stair training;Functional mobility training;Therapeutic activities;Therapeutic exercise;Balance training;Patient/family education     PT Goals(Current goals can be found in the care plan section) Acute Rehab PT Goals Patient Stated Goal: to not need to use a RW PT Goal Formulation: With patient/family Time For Goal Achievement: 05/07/13 Potential to Achieve Goals: Good  Visit Information  Last PT Received On: 04/23/13 History of Present Illness: Pt with hx of recent stroke last week, HTN, HL comes in with cc of new difficulty with swallowing and slurred speech. Pt is last normal at 9 am. Between 9:30 and 10, pt was in the shower, and started having slurred speech and difficulty swallowing and new left sided weakness. MRI reveals new frontal infarct.       Prior Functioning  Home Living Family/patient expects to be discharged to:: Private residence Living Arrangements: Children Available Help at Discharge: Family;Available 24 hours/day Type of Home: House Home Access: Stairs to enter [employer] of Steps: 1 Entrance Stairs-Rails: None Home Layout: One level Home Equipment: Shower seat Prior Function Level of Independence: Needs assistance Communication Communication: HOH;Other  (  comment) (wears glasses all the time, slurred speech) Dominant Hand: Right    Cognition  Cognition Arousal/Alertness: Awake/alert Behavior During Therapy: WFL for tasks assessed/performed Overall Cognitive Status: Within Functional Limits for tasks assessed    Extremity/Trunk Assessment Upper Extremity Assessment Upper Extremity Assessment: Defer to OT evaluation Lower Extremity Assessment Lower Extremity Assessment: Generalized weakness (symertrical)   Balance Balance Balance Assessed: Yes Static Sitting Balance Static Sitting - Balance Support: Feet supported Static Sitting - Level of Assistance: 7: Independent Static Standing Balance Static Standing - Balance Support: During functional activity Static Standing - Level of Assistance: 5: Stand by assistance Static Standing - Comment/# of Minutes: 2 minutes High Level Balance High Level Balance Activites: Backward walking;Direction changes;Turns High Level Balance Comments: min assist for stability  End of Session PT - End of Session Equipment Utilized During Treatment: Gait belt Activity Tolerance: Patient tolerated treatment well Patient left: in chair;with call bell/phone within reach;with family/visitor present Nurse Communication: Mobility status  GP     Fabio Asa 04/23/2013, 9:56 AM Charlotte Crumb, PT DPT  [phone]

## 2013-04-23 NOTE — Evaluation (Signed)
Occupational Therapy Evaluation Patient Details Name: [name] MRN: [id number] DOB: [birth year] Today's Date: 04/23/2013 Time: 1300-1350 OT Time Calculation (min): 50 min  OT Assessment / Plan / Recommendation History of present illness Pt with hx of recent stroke last week, HTN, HL comes in with cc of new difficulty with swallowing and slurred speech. Pt is last normal at 9 am. Between 9:30 and 10, pt was in the shower, and started having slurred speech and difficulty swallowing and new left sided weakness. MRI reveals new frontal infarct.   Clinical Impression   Pt presents with L side weakness, with intact sensation, interfering with ability to perform ADL, transfers, and ambulation.  Will follow acutely.  Instructed pt in AROM and incorporating use of L UE in ADL as much as possible. Daughter is to have back sx on 12/19 and family is concerned that pt she will not be able to help pt adequately.     OT Assessment  Patient needs continued OT Services    Follow Up Recommendations  Home health OT;Supervision/Assistance - 24 hour    Barriers to Discharge      Equipment Recommendations   (RW)    Recommendations for Other Services    Frequency  Min 3X/week    Precautions / Restrictions Precautions Precautions: Fall Restrictions Weight Bearing Restrictions: No   Pertinent Vitals/Pain No pain, VSS    ADL  Eating/Feeding: Set up Where Assessed - Eating/Feeding: Chair Grooming: Wash/dry face;Wash/dry hands;Supervision/safety Where Assessed - Grooming: Unsupported standing Upper Body Bathing: Supervision/safety Where Assessed - Upper Body Bathing: Unsupported sitting Lower Body Bathing: Min guard Where Assessed - Lower Body Bathing: Unsupported sitting;Supported sit to stand Upper Body Dressing: Set up Where Assessed - Upper Body Dressing: Unsupported sitting Lower Body Dressing: Min guard Where Assessed - Lower Body Dressing: Supported sit to stand Toilet Transfer: Photographer Method: Sit to Barista: Comfort height toilet Toileting - Architect and Hygiene: Minimal assistance Where Assessed - Engineer, mining and Hygiene: Standing Equipment Used: Gait belt;Rolling walker Transfers/Ambulation Related to ADLs: supervision to min guard assist, verbal cues for hand placement. ADL Comments: Pt able to donn and doff socks, but requires an extended time.    OT Diagnosis: Generalized weakness;Hemiplegia non-dominant side  OT Problem List: Decreased range of motion;Decreased strength;Impaired balance (sitting and/or standing);Decreased coordination;Impaired UE functional use OT Treatment Interventions: Self-care/ADL training;Neuromuscular education;Therapeutic exercise;Therapeutic activities;Patient/family education;Balance training   OT Goals(Current goals can be found in the care plan section) Acute Rehab OT Goals Patient Stated Goal: return home, regain use of L UE OT Goal Formulation: With patient Time For Goal Achievement: 04/30/13 Potential to Achieve Goals: Good ADL Goals Pt Will Perform Eating: with supervision;sitting (including opening packages) Pt Will Perform Grooming: with supervision;standing Pt Will Perform Lower Body Bathing: with supervision;sit to/from stand Pt Will Perform Lower Body Dressing: with supervision;sit to/from stand Pt Will Transfer to Toilet: with supervision;ambulating Pt Will Perform Toileting - Clothing Manipulation and hygiene: with supervision;sit to/from stand Pt Will Perform Tub/Shower Transfer: with min guard assist;ambulating;shower seat;Tub transfer Pt/caregiver will Perform Home Exercise Program: Increased strength;Left upper extremity;With Supervision (AROM 10 reps x 2 all areas R UE )  Visit Information  Last OT Received On: 04/23/13 Assistance Needed: +1 History of Present Illness: Pt with hx of recent stroke last week, HTN, HL comes in with cc of  new difficulty with swallowing and slurred speech. Pt is last normal at 9 am. Between 9:30 and 10, pt was  in the shower, and started having slurred speech and difficulty swallowing and new left sided weakness. MRI reveals new frontal infarct.       Prior Functioning     Home Living Family/patient expects to be discharged to:: Private residence Living Arrangements: Children Available Help at Discharge: Family;Available 24 hours/day Type of Home: House Home Access: Stairs to enter [employer] of Steps: 1 Entrance Stairs-Rails: None Home Layout: One level Home Equipment: Shower seat Prior Function Level of Independence: Needs assistance Gait / Transfers Assistance Needed: assist for  in and out of tub Communication Communication: HOH;Expressive difficulties (has hearing aides) Dominant Hand: Right         Vision/Perception Vision - History Baseline Vision: Wears glasses all the time Patient Visual Report: No change from baseline   Cognition  Cognition Arousal/Alertness: Awake/alert Behavior During Therapy: WFL for tasks assessed/performed Overall Cognitive Status: Within Functional Limits for tasks assessed    Extremity/Trunk Assessment Upper Extremity Assessment Upper Extremity Assessment: LUE deficits/detail LUE Deficits / Details: 4-/5 shoulder, 4/5 elbow, 4-/5 forearm and wrist, 3+/5 gross grasp, not yet able to extend fingers fully. LUE Coordination: decreased fine motor;decreased gross motor Lower Extremity Assessment Lower Extremity Assessment: Defer to PT evaluation     Mobility Bed Mobility Bed Mobility: Supine to Sit;Sit to Supine Supine to Sit: 5: Supervision Sit to Supine: 5: Supervision Details for Bed Mobility Assistance: no physical assist required, needed extra time Transfers Transfers: Sit to Stand;Stand to Sit Sit to Stand: 5: Supervision;With upper extremity assist;From chair/3-in-1;From toilet;From bed Stand to Sit: 5:  Supervision;With upper extremity assist;To bed;To toilet;To chair/3-in-1 Details for Transfer Assistance: verbal cues for hand placement     Exercise     Balance Balance Balance Assessed: Yes Static Sitting Balance Static Sitting - Balance Support: Feet supported Static Sitting - Level of Assistance: 7: Independent Static Standing Balance Static Standing - Balance Support: During functional activity Static Standing - Level of Assistance: 5: Stand by assistance   End of Session OT - End of Session Activity Tolerance: Patient tolerated treatment well Patient left: in chair;with call bell/phone within reach;with family/visitor present  GO     Evern Bio 04/23/2013, 2:04 PM [phone]

## 2013-04-23 NOTE — Evaluation (Addendum)
Clinical/Bedside Swallow Evaluation Patient Details  Name: [name] MRN: [id number] Date of Birth: [birth year]  Today's Date: 04/23/2013 Time: 1308-6578 SLP Time Calculation (min): 25 min  Past Medical History:  Past Medical History  Diagnosis Date  . Stroke   . Bipolar disorder   . Hypertension   . Depression   . Hypercholesteremia    Past Surgical History: History reviewed. No pertinent past surgical history. HPI:  79 year old female with history of hypertension, hyperlipidemia, previous CVA, bipolar disorder admitted with acute R frontal CVA, R MCA disease/small aneurysm.    Assessment / Plan / Recommendation Clinical Impression  Patient presents with an overall functional oropharyngeal swallow. Intermittent s/s of aspiration noted with thin liquids characterized by coughing post swallow, suspect due to an age and neuro related (h/o CVAs) delay in swallow initiation, which son present reported is baseline for this patient. Cues for small single sips and full clearance of oral cavity of solids prior to sips of liquid successful at decreasing episodes. Suspect a mild chronic dysphagia at baseline which patient seems to be managing. Recommend regular diet, thin liquid with use of compensatory strategies and aspiration precautions. SLP will monitor for need for further education and/or need for objective evaluation.     Screened for speech/language/cognitive deficits. Per son and patient, patient at baseline. Defer formal cognitive-linguistic evaluation.    Aspiration Risk  Mild    Diet Recommendation Regular;Thin liquid   Liquid Administration via: Cup;No straw Medication Administration: Whole meds with puree Supervision: Patient able to self feed;Intermittent supervision to cue for compensatory strategies Compensations: Slow rate;Small sips/bites Postural Changes and/or Swallow Maneuvers: Seated upright 90 degrees    Other  Recommendations Oral Care Recommendations: Oral  care BID   Follow Up Recommendations  None    Frequency and Duration min 2x/week  1 week   Pertinent Vitals/Pain n/a        Swallow Study Prior Functional Status  Type of Home: House Available Help at Discharge: Family;Available 24 hours/day    General HPI: 79 year old female with history of hypertension, hyperlipidemia, previous CVA, bipolar disorder admitted with acute R frontal CVA, R MCA disease/small aneurysm.  Type of Study: Bedside swallow evaluation Previous Swallow Assessment: esophagram 2008 indlucated small hiatal hernia, prominent CP Diet Prior to this Study: NPO Temperature Spikes Noted: No Respiratory Status: Room air History of Recent Intubation: No Behavior/Cognition: Alert;Cooperative;Pleasant mood;Hard of hearing Oral Cavity - Dentition: Dentures, top;Dentures, bottom (partials) Self-Feeding Abilities: Able to feed self Patient Positioning: Upright in chair Baseline Vocal Quality: Clear Volitional Cough: Strong Volitional Swallow: Able to elicit    Oral/Motor/Sensory Function Overall Oral Motor/Sensory Function: Appears within functional limits for tasks assessed   Ice Chips Ice chips: Not tested   Thin Liquid Thin Liquid: Impaired Presentation: Cup;Straw;Self Fed Pharyngeal  Phase Impairments: Cough - Delayed (intermittent)    Nectar Thick Nectar Thick Liquid: Not tested   Honey Thick Honey Thick Liquid: Not tested   Puree Puree: Within functional limits Presentation: Self Fed;Spoon   Solid   GO   Victoria Dombek MA, CCC-SLP [phone]  Solid: Within functional limits Presentation: Self Fed       [name] [name] 04/23/2013,11:33 AM

## 2013-04-23 NOTE — Clinical Documentation Improvement (Signed)
THIS DOCUMENT IS NOT A PERMANENT PART OF THE MEDICAL RECORD  Please update your documentation with the medical record to reflect your response to this query. If you need help knowing how to do this please call [phone].  04/23/13   Dear Dr.Griffin/Associates,  In a better effort to capture your patient's severity of illness, reflect appropriate length of stay and utilization of resources, a review of the patient medical record has revealed the following indicators.    Based on your clinical judgment, please clarify and document in a progress note and/or discharge summary the clinical condition associated with the following supporting information:  In responding to this query please exercise your independent judgment.  The fact that a query is asked, does not imply that any particular answer is desired or expected.   Possible Clinical Conditions?   CKD Stage II - GFR 60-80  CKD Stage III - GFR 30-59  Other condition  Cannot Clinically determine     Risk Factors:  CKD noted per 12/10 progress notes.   Lab: 12/09  Bun: 23 Creat:  1.07 GFR: 45   You may use possible, probable, or suspect with inpatient documentation. possible, probable, suspected diagnoses MUST be documented at the time of discharge  Reviewed:  Additional documentation provided per 04/24/13 discharge summary.                                        Thank You,  Marciano Sequin, Clinical Documentation Specialist: [phone] Health Information Management [place]

## 2013-04-23 NOTE — Progress Notes (Signed)
Subjective: L hand still weak.  Objective: Vital signs in last 24 hours: Temp:  [97.4 F (36.3 C)-98.6 F (37 C)] 98.1 F (36.7 C) (12/10 0550) Pulse Rate:  [71-89] 71 (12/10 0550) Resp:  [18-27] 20 (12/10 0550) BP: (120-171)/(46-106) 138/55 mmHg (12/10 0550) SpO2:  [95 %-98 %] 97 % (12/10 0550) Weight:  [54.432 kg (120 lb)] 54.432 kg (120 lb) (12/09 2030) Weight change:  Last BM Date: 04/21/13  Intake/Output from previous day:   Intake/Output this shift:    General appearance: alert and cooperative Resp: clear to auscultation bilaterally Cardio: regular rate and rhythm, S1, S2 normal, no murmur, click, rub or gallop Extremities: extremities normal, atraumatic, no cyanosis or edema Neurologic: Mental status: Alert, oriented, thought content appropriate Cranial nerves: VII: lower facial muscle function reduced on the left Motor: decreased L handgrip  Lab Results:  Recent Labs  04/22/13 1235  WBC 6.5  HGB 11.2*  HCT 33.2*  PLT PLATELETS APPEAR ADEQUATE   BMET  Recent Labs  04/22/13 1235  NA 135  K 4.3  CL 102  CO2 21  GLUCOSE 92  BUN 23  CREATININE 1.07  CALCIUM 9.9    Studies/Results: Ct Head (brain) Wo Contrast  04/22/2013   CLINICAL DATA:  Slurred speech, left arm weakness  EXAM: CT HEAD WITHOUT CONTRAST  TECHNIQUE: Contiguous axial images were obtained from the base of the skull through the vertex without intravenous contrast.  COMPARISON:  None.  FINDINGS: There is diffuse cortical atrophy as well as areas of low attenuation within the subcortical, deep, and periventricular white matter regions. A focal area of low attenuation projects just posterior to the caudate nucleus on the left. Status the appearance of a subacute to chronic region of the colon or infarction. There is no evidence of mass effect. The ventricles and cisterns are patent. An ill-defined area of low attenuation projects along the medial periphery of the right cerebellar hemisphere  suspicious for a region of chronic lacunar infarction image number 7 series 2. The osseous structures demonstrate no evidence of a depressed skull fracture. The visualized paranasal sinuses and mastoid air cells are patent.  IMPRESSION: Chronic and involutional changes without evidence of acute abnormalities.   Electronically Signed   By: Salome Holmes M.D.   On: 04/22/2013 12:31   Mr Maxine Glenn Head Wo Contrast  04/22/2013   CLINICAL DATA:  Stroke.  New slurred speech.  Recent stroke.  EXAM: MRI HEAD WITHOUT CONTRAST  MRA HEAD WITHOUT CONTRAST  TECHNIQUE: Multiplanar, multiecho pulse sequences of the brain and surrounding structures were obtained without intravenous contrast. Angiographic images of the head were obtained using MRA technique without contrast.  COMPARISON:  MRI 04/16/2013  FINDINGS: MRI HEAD FINDINGS  Acute infarct in the high a right frontal cortex. This appears to be in the motor strip. This infarct measures approximately 10 x 15 mm. Additional small punctate areas of acute infarct in the right frontal and parietal white matter and in the right lateral temporal lobe.  Chronic microvascular ischemic change in the white matter. Chronic infarct in the left thalamus is unchanged. Chronic ischemia in the pons. Chronic infarct in the right cerebellum.  Negative for hemorrhage or mass. There is generalized atrophy without hydrocephalus or midline shift.  MRA HEAD FINDINGS  There is moderate to advanced intracranial atherosclerotic disease.  There is irregularity the distal right vertebral artery which is patent to the basilar. Distal left vertebral artery ends in the posterior inferior cerebellar artery and does not contribute to  the basilar. The basilar is attenuated due to fetal origin of the posterior cerebral arteries bilaterally. Superior cerebellar and posterior cerebral arteries are patent bilaterally. There is atherosclerotic irregularity in the posterior cerebral artery bilaterally.  Right internal  carotid artery is patent. There is atherosclerotic disease in the right A1 and M1 segments which are mildly irregular. There is an aneurysm of the distal right M 1 segment measuring 3 x 5 mm. This is unchanged from the recent study and also is present on 10/30/2005. The right parietal branch shows improved lumen diameter compared with this prior study. There remains occlusion of the anterior branch of the right middle cerebral artery, unchanged from the prior study.  Left cavernous carotid artery is patent. Atherosclerotic disease in the left M1 segment and left middle cerebral artery branches without large vessel occlusion. Left anterior cerebral artery is patent.  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 Right MCA bifurcation aneurysm measuring 3 x 5 mm.  IMPRESSION: Acute infarct in the high right frontal lobe, probable motor strip. Small punctate areas of acute infarct in the right frontal parietal and temporal lobe. This is all in the right MCA distribution.  Moderate to advanced chronic ischemic changes as above.  Severe intracranial atherosclerotic disease. The posterior division of the right middle cerebral artery shows improved vessel caliber compared with the prior study which may be due to resolving thrombosis versus technical fractures. The anterior division of the right middle cerebral artery is chronically occluded.  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 It is unclear if this is related to the patient's multiple infarcts on the right, however thrombus in the aneurysm could be embolizing and causing recent infarctions. . 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   Electronically Signed   By: Marlan Palau M.D.   On: 04/22/2013 16:10   Mr Brain Wo Contrast  04/22/2013   CLINICAL DATA:  Stroke.  New slurred speech.  Recent stroke.  EXAM: MRI HEAD WITHOUT CONTRAST  MRA HEAD WITHOUT CONTRAST  TECHNIQUE: Multiplanar, multiecho pulse sequences of the brain  and surrounding structures were obtained without intravenous contrast. Angiographic images of the head were obtained using MRA technique without contrast.  COMPARISON:  MRI 04/16/2013  FINDINGS: MRI HEAD FINDINGS  Acute infarct in the high a right frontal cortex. This appears to be in the motor strip. This infarct measures approximately 10 x 15 mm. Additional small punctate areas of acute infarct in the right frontal and parietal white matter and in the right lateral temporal lobe.  Chronic microvascular ischemic change in the white matter. Chronic infarct in the left thalamus is unchanged. Chronic ischemia in the pons. Chronic infarct in the right cerebellum.  Negative for hemorrhage or mass. There is generalized atrophy without hydrocephalus or midline shift.  MRA HEAD FINDINGS  There is moderate to advanced intracranial atherosclerotic disease.  There is irregularity the distal right vertebral artery which is patent to the basilar. Distal left vertebral artery ends in the posterior inferior cerebellar artery and does not contribute to the basilar. The basilar is attenuated due to fetal origin of the posterior cerebral arteries bilaterally. Superior cerebellar and posterior cerebral arteries are patent bilaterally. There is atherosclerotic irregularity in the posterior cerebral artery bilaterally.  Right internal carotid artery is patent. There is atherosclerotic disease in the right A1 and M1 segments which are mildly irregular. There is an aneurysm of the distal right M 1 segment measuring 3 x 5 mm. This is unchanged  from the recent study and also is present on 10/30/2005. The right parietal branch shows improved lumen diameter compared with this prior study. There remains occlusion of the anterior branch of the right middle cerebral artery, unchanged from the prior study.  Left cavernous carotid artery is patent. Atherosclerotic disease in the left M1 segment and left middle cerebral artery branches without  large vessel occlusion. Left anterior cerebral artery is patent.  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 Right MCA bifurcation aneurysm measuring 3 x 5 mm.  IMPRESSION: Acute infarct in the high right frontal lobe, probable motor strip. Small punctate areas of acute infarct in the right frontal parietal and temporal lobe. This is all in the right MCA distribution.  Moderate to advanced chronic ischemic changes as above.  Severe intracranial atherosclerotic disease. The posterior division of the right middle cerebral artery shows improved vessel caliber compared with the prior study which may be due to resolving thrombosis versus technical fractures. The anterior division of the right middle cerebral artery is chronically occluded.  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 It is unclear if this is related to the patient's multiple infarcts on the right, however thrombus in the aneurysm could be embolizing and causing recent infarctions. . 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   Electronically Signed   By: Marlan Palau M.D.   On: 04/22/2013 16:10    Medications: I have reviewed the patient's current medications.  Assessment/Plan: Principal Problem:   CVA (cerebral infarction)  Acute R frontal CVA, R MCA disease/small aneurysm, on ASA/Plavix, await Neurology recommendation on final regimen.  2D echo today, PT/OT/ST to see today, npo for now, IV normal saline. Active Problems:   Hypertension following, BP med on hold   Hyperlipidemia lipitor recently started, restart today   Chronic kidney disease   LOS: 1 day   [name] JOSEPH 04/23/2013, 7:51 AM

## 2013-04-24 DIAGNOSIS — I10 Essential (primary) hypertension: Secondary | ICD-10-CM | POA: Diagnosis not present

## 2013-04-24 DIAGNOSIS — I6350 Cerebral infarction due to unspecified occlusion or stenosis of unspecified cerebral artery: Secondary | ICD-10-CM | POA: Diagnosis not present

## 2013-04-24 LAB — BASIC METABOLIC PANEL
BUN: 28 mg/dL — ABNORMAL HIGH (ref 6–23)
Calcium: 8.8 mg/dL (ref 8.4–10.5)
Creatinine, Ser: 1.04 mg/dL (ref 0.50–1.10)
GFR calc non Af Amer: 47 mL/min — ABNORMAL LOW (ref >90)
Glucose, Bld: 95 mg/dL (ref 70–99)
Potassium: 3.9 mEq/L (ref 3.5–5.1)

## 2013-04-24 MED ORDER — ASPIRIN 81 MG PO TBEC: 81.0000 mg | DELAYED_RELEASE_TABLET | Freq: Every day | ORAL | Status: DC

## 2013-04-24 MED ORDER — ENOXAPARIN SODIUM 30 MG/0.3ML ~~LOC~~ SOLN
30.0000 mg | SUBCUTANEOUS | Status: DC
  Filled 2013-04-24: qty 0.3

## 2013-04-24 MED ORDER — SODIUM CHLORIDE 0.9 % IV SOLN
INTRAVENOUS | Status: DC
  Administered 2013-04-24: 09:00:00 via INTRAVENOUS

## 2013-04-24 NOTE — Discharge Summary (Signed)
Physician Discharge Summary  Patient ID: [name] MRN: [id number] DOB/AGE: 77-May-1925 77 y.o.  Admit date: 04/22/2013 Discharge date: 04/24/2013  Admission Diagnoses: Cerebrovascular infarction Hypertension Hyperlipidemia Chronic kidney disease stage III  Discharge Diagnoses:  Principal Problem:   CVA (cerebral infarction) Active Problems:   Hypertension   Hyperlipidemia   Chronic kidney disease stage III   Discharged Condition: good  Hospital Course: The patient was admitted on December 9 because of slurred speech and left hand clumsiness and numbness. The patient had an MRI showing acute right frontal lobe infarct, moderate to advanced chronic ischemic changes, 3 x 5 mm right MCA bifurcation aneurysm intracranial atherosclerotic disease. The patient was started on aspirin and Plavix by neurology. Echocardiogram showed no significant abnormality. The patient's left hand clumsiness did improve and her speech was at baseline. Speech therapy saw the patient and felt her to be a low risk for aspiration and she was restarted on a regular diet with thin liquids. Physical and occupational therapy saw patient about home health PT and OT would be appropriate. Neurology recommended a 30 day monitor h or fibrillation and this was arranged. Telemetry showed normal sinus rhythm. Blood pressure is low as 110 systolic on the day of discharge while holding the patient's blood pressure medicine and this was held at discharge  Consults: neurology  Significant Diagnostic Studies: labs: Sodium 132 potassium 3.9 chloride bicarbonate 19, BUN 28, creatinine 1.04 radiology: MRI: MRI showed acute right frontal CVA and 2 punctate areas of right frontal CVA, MRA showed 3 x 5 mm right MCA bifurcation aneurysm and diffuse intracranial disease and CT scan: No acute change and cardiac graphics: Echocardiogram: Normal LV function and diastolic dysfunction  Treatments: IV hydration, anticoagulation: ASA and Plavix  and therapies: PT, OT and ST  Discharge Exam: Blood pressure 110/53, pulse 67, temperature 98.3 F (36.8 C), temperature source Oral, resp. rate 18, height 4\' 11"  (1.499 m), weight 54.432 kg (120 lb), SpO2 95.00%. General appearance: alert and cooperative Resp: clear to auscultation bilaterally Cardio: regular rate and rhythm, S1, S2 normal, no murmur, click, rub or gallop Neurologic: Motor: Slight weakness left hand grip  Disposition:      Medication List    STOP taking these medications       AZOR PO      TAKE these medications       aspirin 81 MG EC tablet  Take 1 tablet (81 mg total) by mouth daily.     atorvastatin 10 MG tablet  Commonly known as:  LIPITOR  Take 10 mg by mouth daily.     clopidogrel 75 MG tablet  Commonly known as:  PLAVIX  Take 75 mg by mouth daily with breakfast.     docusate sodium 50 MG capsule  Commonly known as:  COLACE  Take 50 mg by mouth daily.     LORazepam 0.5 MG tablet  Commonly known as:  ATIVAN  Take 0.25 mg by mouth daily as needed for anxiety (pt take a 1/4 of a 0.5 tablet).     MULTI COMPLETE PO  Take 1 tablet by mouth daily.     PRILOSEC PO  Take 1 tablet by mouth 2 (two) times daily.     QUEtiapine 50 MG tablet  Commonly known as:  SEROQUEL  Take 50 mg by mouth at bedtime. Pt can only use brand name drug     sertraline 100 MG tablet  Commonly known as:  ZOLOFT  Take 100 mg by mouth daily.  Follow-up Information   Follow up In 2 weeks.      Follow up with Gates Rigg, MD In 2 months.   Specialties:  Neurology, Radiology   Contact information:   [address] [place] [address] [phone]       Follow up with Lillia Mountain, MD In 2 weeks.   Specialty:  Internal Medicine   Contact information:   [address] [place] [address] [phone]       Signed: Lillia Mountain 04/24/2013, 7:45 AM

## 2013-04-24 NOTE — Plan of Care (Signed)
D/c instructions given to pt's family. V/u. Pt is stable to be d/c. No med scripts given to pt.

## 2013-04-24 NOTE — Progress Notes (Signed)
Pt observed to have about 7 beats of V-tach at 0647,pt was using the bathroom at this time,denies any discomfort, Dr Valentina Lucks (exchanged)paged and notified,awaiting call back,message replayed to the morning RN May,pt reassured. [name], [name]

## 2013-04-24 NOTE — Progress Notes (Signed)
Talked to patient with daughter in law Psychologist, forensic) present about HHC options; Beth wanted to talk to her spouse before making a decision; Patient will be staying with her and her son at discharge;  [address] [place] [address] [facility] cell # [phone]

## 2013-04-24 NOTE — Progress Notes (Signed)
Stroke Team Progress Note  HISTORY [name] is an 79 y.o. female who was recently seen by PCP 04/16/13 for slurred speech and found to have a right frontal infarcts (puncatate) on MRI. Stroke workup to be done as a out patient . Per notes, she did have Carotid dopplers which were negative. This AM 04/22/2013 her daughter noted she had increased slurred speech and increased left facial droop. On arrival patient described a left face and arm paresthesia and mild left facial droop. Currently her exam only shows minimal symptoms of left facial droop and subjective decreased sensation in left arm and leg. NIHSS 3. Patient was not a TPA candidate secondary to stroke within the last 90 days. She was admitted for further evaluation and treatment.  SUBJECTIVE Her daughter-in-law is at the bedside. She plans to care for pt at time of discharge.improving left hand strength  OBJECTIVE Most recent Vital Signs: Filed Vitals:   04/23/13 2200 04/24/13 0006 04/24/13 0644 04/24/13 1024  BP: 156/69 110/53 139/75 150/61  Pulse: 71 67 69 64  Temp: 97.6 F (36.4 C) 98.3 F (36.8 C) 97.7 F (36.5 C) 97.6 F (36.4 C)  TempSrc: Oral Oral Oral Oral  Resp: 20 18 20 20   Height:      Weight:      SpO2: 95% 95% 96% 97%   CBG (last 3)   Recent Labs  04/22/13 1220  GLUCAP 157*    IV Fluid Intake:   . sodium chloride 100 mL/hr at 04/24/13 0847    MEDICATIONS  . aspirin EC  81 mg Oral Daily  . atorvastatin  10 mg Oral Daily  . atorvastatin  10 mg Oral q1800  . clopidogrel  75 mg Oral Q breakfast  . docusate sodium  50 mg Oral Daily  . enoxaparin (LOVENOX) injection  40 mg Subcutaneous Q24H  . pantoprazole  40 mg Oral Daily  . QUEtiapine  50 mg Oral QHS  . sertraline  100 mg Oral Daily   PRN:  acetaminophen, acetaminophen, hydrALAZINE, LORazepam, LORazepam, senna-docusate  Diet:  General thin liquids Activity:   DVT Prophylaxis:  Lovenox 40 mg sq daily   CLINICALLY SIGNIFICANT STUDIES Basic  Metabolic Panel:   Recent Labs Lab 04/22/13 1235 04/24/13 0530  NA 135 133*  K 4.3 3.9  CL 102 105  CO2 21 19  GLUCOSE 92 95  BUN 23 28*  CREATININE 1.07 1.04  CALCIUM 9.9 8.8   Liver Function Tests:   Recent Labs Lab 04/22/13 1235  AST 24  ALT 19  ALKPHOS 82  BILITOT 0.4  PROT 7.8  ALBUMIN 4.0   CBC:   Recent Labs Lab 04/22/13 1235  WBC 6.5  NEUTROABS 4.3  HGB 11.2*  HCT 33.2*  MCV 90.2  PLT PLATELETS APPEAR ADEQUATE   Coagulation:   Recent Labs Lab 04/22/13 1235  LABPROT 12.8  INR 0.98   Cardiac Enzymes:   Recent Labs Lab 04/22/13 1235  TROPONINI <0.30   Urinalysis: No results found for this basename: COLORURINE, APPERANCEUR, LABSPEC, PHURINE, GLUCOSEU, HGBUR, BILIRUBINUR, KETONESUR, PROTEINUR, UROBILINOGEN, NITRITE, LEUKOCYTESUR,  in the last 168 hours Lipid Panel    Component Value Date/Time   CHOL 171 04/23/2013 0525   TRIG 147 04/23/2013 0525   HDL 36* 04/23/2013 0525   CHOLHDL 4.8 04/23/2013 0525   VLDL 29 04/23/2013 0525   LDLCALC 106* 04/23/2013 0525   HgbA1C  Lab Results  Component Value Date   HGBA1C 5.8* 04/23/2013    Urine Drug Screen:  No results found for this basename: labopia,  cocainscrnur,  labbenz,  amphetmu,  thcu,  labbarb    Alcohol Level: No results found for this basename: ETH,  in the last 168 hours   CT of the brain  04/22/2013    Chronic and involutional changes without evidence of acute abnormalities.   MRI of the brain  04/22/2013   Acute infarct in the high right frontal lobe, probable motor strip. Small punctate areas of acute infarct in the right frontal parietal and temporal lobe. This is all in the right MCA distribution.  Moderate to advanced chronic ischemic changes as above.  Severe intracranial atherosclerotic disease.  04/16/2013 punctate right frontal infarcts  MRA of the brain  04/22/2013     The posterior division of the right middle cerebral artery shows improved vessel caliber compared with the  prior study which may be due to resolving thrombosis versus technical fractures. The anterior division of the right middle cerebral artery is chronically occluded.  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 It is unclear if this is related to the patient's multiple infarcts on the right, however thrombus in the aneurysm could be embolizing and causing recent infarctions. . 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    2D Echocardiogram  EF 65-70% with no source of embolus.   Carotid Doppler  04/17/2013 Bilateral carotid bifurcation and proximal ICA plaque, resulting in less than 50% diameter stenosis. The exam does not exclude plaque ulceration or embolization. Continued surveillance recommended.  CXR    EKG  normal sinus rhythm, Left axis deviation, Inferior  infarct , age undetermined, Anterior infarct , age undetermined, Abnormal ECG.   Therapy Recommendations home health PT  Physical Exam   Frail elderly Caucasian lady.Awake alert. Afebrile. Head is nontraumatic. Neck is supple without bruit. Hearing is normal. Cardiac exam no murmur or gallop. Lungs are clear to auscultation. Distal pulses are well felt. Neurological Exam ;  Awake  Alert oriented x 3. Normal speech and language.eye movements full without nystagmus.fundi were not visualized. Vision acuity and fields appear normal. Hearing is normal. Palatal movements are normal. Face symmetric. Tongue midline. Normal strength except mild left grip weak and  Diminished fine finger movements on left but normal , tone, reflexes and coordination. Normal sensation. Gait deferred.  ASSESSMENT Ms. [name] is a 79 y.o. female presenting with increased slurred speech and increased left facial droop in setting of recently known right punctate parietal infarcts. Repeat MRI confirms a new high right frontal lobe infarct with small punctate acute infarct in the right frontal parietal and temporal lobe. infarct. Infarcts  felt to be embolic secondary to atrial fibrillation vs R MCA aneurysmal thrombus.  Recently changed from aspirin to clopidogrel 75 mg orally every day prior to admission. Now on aspirin 325 mg orally every day and clopidogrel 75 mg orally every day for secondary stroke prevention. Patient with resultant left hemiparesis, left facial droop and dysarthria (all improving). Work up completed.  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  2 mm anterior communicating artery aneurysm, unchanged.     Hospital day # 2  TREATMENT/PLAN  Continue dual antiplatelets,  aspirin 81 mg orally every day and clopidogrel 75 mg orally every day for secondary  stroke prevention together x 3 months then plavix alone.  Home health PT, RW w/ 5" wheels Have scheduled outpatient telemetry monitoring with [facility] to assess patient for atrial fibrillation as source of stroke. They will contact her/family for arrangements. Patient has a 10-15% risk of having another stroke over the next year, the highest risk is within 2 weeks of the most recent stroke/TIA (risk of having a stroke following a stroke or TIA is the same). Ongoing risk factor control by Primary Care Physician Stroke Service will sign off. Please call should any needs arise. D/w daughter in law at bedside Follow up with Dr. Pearlean Brownie, Stroke Clinic, in 2 months.   Annie Main, MSN, RN, ANVP-BC, ANP-BC, [facility] Pager: [phone] 04/24/2013 10:35 AM  I have personally obtained a history, examined the patient, evaluated imaging results, and formulated the assessment and plan of care. I agree with the above.  Delia Heady, MD

## 2013-04-24 NOTE — Progress Notes (Signed)
Speech Language Pathology Treatment: Dysphagia  Patient Details Name: [name] MRN: [id number] DOB: 1924/02/22 Today's Date: 04/24/2013 Time: 1191-4782 SLP Time Calculation (min): 8 min  Assessment / Plan / Recommendation Clinical Impression  F/u after yesterday's swallow assessment.  Pt tolerating regular diet, thin liquids with baseline compensatory strategies to protect airway.  Today, no coughing nor other s/s of compromised airway protection.  Pt self-regulating bolus size and rate; taking her time and being cautious with PO intake.  For D/C this afternoon - no f/u SLP warranted.  Pt agrees.   HPI HPI: 79 year old female with history of hypertension, hyperlipidemia, previous CVA, bipolar disorder admitted with acute R frontal CVA, R MCA disease/small aneurysm.       SLP Plan  All goals met    Recommendations Diet recommendations: Regular;Thin liquid Liquids provided via: Cup Medication Administration: Whole meds with puree Supervision: Patient able to self feed Compensations: Slow rate;Small sips/bites Postural Changes and/or Swallow Maneuvers: Seated upright 90 degrees              Plan: All goals met        [name], [place] CCC/SLP Pager [phone]  Blenda Mounts Laurice 04/24/2013, 11:28 AM

## 2013-04-24 NOTE — Progress Notes (Signed)
Occupational Therapy Treatment Patient Details Name: [name] MRN: [id number] DOB: 04/03/1924 Today's Date: 04/24/2013 Time: 4098-1191 OT Time Calculation (min): 47 min  OT Assessment / Plan / Recommendation  History of present illness Pt with hx of recent stroke last week, HTN, HL comes in with cc of new difficulty with swallowing and slurred speech. Pt is last normal at 9 am. Between 9:30 and 10, pt was in the shower, and started having slurred speech and difficulty swallowing and new left sided weakness. MRI reveals new frontal infarct.   OT comments  Family is comfortable taking pt home.  Will have 24 hour supervision.  Daughter in law is encouraging independence and supervising for safety only.  Implemented home exercise program for R UE.  Plan is for home with HHOT today.  Follow Up Recommendations  Home health OT;Supervision/Assistance - 24 hour    Barriers to Discharge       Equipment Recommendations  None recommended by OT (has a walker she can borrow)    Recommendations for Other Services    Frequency Min 3X/week   Progress towards OT Goals Progress towards OT goals: Progressing toward goals  Plan Discharge plan remains appropriate    Precautions / Restrictions Precautions Precautions: Fall Restrictions Weight Bearing Restrictions: No   Pertinent Vitals/Pain No pain, VSS.    ADL  Eating/Feeding: Independent Where Assessed - Eating/Feeding: Chair Grooming: Wash/dry hands;Supervision/safety Where Assessed - Grooming: Unsupported standing Upper Body Dressing: Set up Where Assessed - Upper Body Dressing: Unsupported sitting Lower Body Dressing: Supervision/safety Where Assessed - Lower Body Dressing: Unsupported sitting;Supported sit to stand Toilet Transfer: Supervision/safety Toilet Transfer Method: Sit to Barista: Comfort height toilet Toileting - Clothing Manipulation and Hygiene: Supervision/safety Where Assessed - International aid/development worker and Hygiene: Sit to stand from 3-in-1 or toilet Equipment Used: Gait belt;Rolling walker Transfers/Ambulation Related to ADLs: supervision and verbal cues for ambulation with RW ADL Comments: Instructed and issued med soft putty exercises for finger extension, abd/add, and wrist ext.  Worked on weight bearing through palm to stretch fingers.  Daughter in law observed all.  Gave fine motor home exercise program sheet.    OT Diagnosis:    OT Problem List:   OT Treatment Interventions:     OT Goals(current goals can now be found in the care plan section)    Visit Information  Last OT Received On: 04/24/13 Assistance Needed: +1 History of Present Illness: Pt with hx of recent stroke last week, HTN, HL comes in with cc of new difficulty with swallowing and slurred speech. Pt is last normal at 9 am. Between 9:30 and 10, pt was in the shower, and started having slurred speech and difficulty swallowing and new left sided weakness. MRI reveals new frontal infarct.    Subjective Data      Prior Functioning       Cognition  Cognition Arousal/Alertness: Awake/alert Behavior During Therapy: WFL for tasks assessed/performed Overall Cognitive Status: Within Functional Limits for tasks assessed    Mobility  Bed Mobility Bed Mobility: Not assessed Transfers Transfers: Sit to Stand;Stand to Sit Sit to Stand: 5: Supervision Stand to Sit: 5: Supervision Details for Transfer Assistance: verbal cues for hand placement    Exercises      Balance     End of Session OT - End of Session Activity Tolerance: Patient tolerated treatment well Patient left: in chair;with call bell/phone within reach;with family/visitor present  GO     Evern Bio 04/24/2013,  9:35 AM [phone]

## 2013-04-27 DIAGNOSIS — I69991 Dysphagia following unspecified cerebrovascular disease: Secondary | ICD-10-CM | POA: Diagnosis not present

## 2013-04-27 DIAGNOSIS — I69939 Monoplegia of upper limb following unspecified cerebrovascular disease affecting unspecified side: Secondary | ICD-10-CM | POA: Diagnosis not present

## 2013-04-27 DIAGNOSIS — I129 Hypertensive chronic kidney disease with stage 1 through stage 4 chronic kidney disease, or unspecified chronic kidney disease: Secondary | ICD-10-CM | POA: Diagnosis not present

## 2013-04-27 DIAGNOSIS — F319 Bipolar disorder, unspecified: Secondary | ICD-10-CM | POA: Diagnosis not present

## 2013-04-27 DIAGNOSIS — I4891 Unspecified atrial fibrillation: Secondary | ICD-10-CM | POA: Diagnosis not present

## 2013-04-28 DIAGNOSIS — I69939 Monoplegia of upper limb following unspecified cerebrovascular disease affecting unspecified side: Secondary | ICD-10-CM | POA: Diagnosis not present

## 2013-04-28 DIAGNOSIS — F319 Bipolar disorder, unspecified: Secondary | ICD-10-CM | POA: Diagnosis not present

## 2013-04-28 DIAGNOSIS — I69991 Dysphagia following unspecified cerebrovascular disease: Secondary | ICD-10-CM | POA: Diagnosis not present

## 2013-04-28 DIAGNOSIS — I4891 Unspecified atrial fibrillation: Secondary | ICD-10-CM | POA: Diagnosis not present

## 2013-04-28 DIAGNOSIS — I129 Hypertensive chronic kidney disease with stage 1 through stage 4 chronic kidney disease, or unspecified chronic kidney disease: Secondary | ICD-10-CM | POA: Diagnosis not present

## 2013-04-30 DIAGNOSIS — F319 Bipolar disorder, unspecified: Secondary | ICD-10-CM | POA: Diagnosis not present

## 2013-04-30 DIAGNOSIS — I69991 Dysphagia following unspecified cerebrovascular disease: Secondary | ICD-10-CM | POA: Diagnosis not present

## 2013-04-30 DIAGNOSIS — I129 Hypertensive chronic kidney disease with stage 1 through stage 4 chronic kidney disease, or unspecified chronic kidney disease: Secondary | ICD-10-CM | POA: Diagnosis not present

## 2013-04-30 DIAGNOSIS — I4891 Unspecified atrial fibrillation: Secondary | ICD-10-CM | POA: Diagnosis not present

## 2013-04-30 DIAGNOSIS — I69939 Monoplegia of upper limb following unspecified cerebrovascular disease affecting unspecified side: Secondary | ICD-10-CM | POA: Diagnosis not present

## 2013-05-01 DIAGNOSIS — I129 Hypertensive chronic kidney disease with stage 1 through stage 4 chronic kidney disease, or unspecified chronic kidney disease: Secondary | ICD-10-CM | POA: Diagnosis not present

## 2013-05-01 DIAGNOSIS — I4891 Unspecified atrial fibrillation: Secondary | ICD-10-CM | POA: Diagnosis not present

## 2013-05-01 DIAGNOSIS — I69939 Monoplegia of upper limb following unspecified cerebrovascular disease affecting unspecified side: Secondary | ICD-10-CM | POA: Diagnosis not present

## 2013-05-01 DIAGNOSIS — I69991 Dysphagia following unspecified cerebrovascular disease: Secondary | ICD-10-CM | POA: Diagnosis not present

## 2013-05-01 DIAGNOSIS — F319 Bipolar disorder, unspecified: Secondary | ICD-10-CM | POA: Diagnosis not present

## 2013-05-02 DIAGNOSIS — I69939 Monoplegia of upper limb following unspecified cerebrovascular disease affecting unspecified side: Secondary | ICD-10-CM | POA: Diagnosis not present

## 2013-05-02 DIAGNOSIS — I129 Hypertensive chronic kidney disease with stage 1 through stage 4 chronic kidney disease, or unspecified chronic kidney disease: Secondary | ICD-10-CM | POA: Diagnosis not present

## 2013-05-02 DIAGNOSIS — I4891 Unspecified atrial fibrillation: Secondary | ICD-10-CM | POA: Diagnosis not present

## 2013-05-02 DIAGNOSIS — I69991 Dysphagia following unspecified cerebrovascular disease: Secondary | ICD-10-CM | POA: Diagnosis not present

## 2013-05-02 DIAGNOSIS — F319 Bipolar disorder, unspecified: Secondary | ICD-10-CM | POA: Diagnosis not present

## 2013-05-05 DIAGNOSIS — F319 Bipolar disorder, unspecified: Secondary | ICD-10-CM | POA: Diagnosis not present

## 2013-05-05 DIAGNOSIS — I69939 Monoplegia of upper limb following unspecified cerebrovascular disease affecting unspecified side: Secondary | ICD-10-CM | POA: Diagnosis not present

## 2013-05-05 DIAGNOSIS — I4891 Unspecified atrial fibrillation: Secondary | ICD-10-CM | POA: Diagnosis not present

## 2013-05-05 DIAGNOSIS — I69991 Dysphagia following unspecified cerebrovascular disease: Secondary | ICD-10-CM | POA: Diagnosis not present

## 2013-05-05 DIAGNOSIS — I129 Hypertensive chronic kidney disease with stage 1 through stage 4 chronic kidney disease, or unspecified chronic kidney disease: Secondary | ICD-10-CM | POA: Diagnosis not present

## 2013-05-06 DIAGNOSIS — I69939 Monoplegia of upper limb following unspecified cerebrovascular disease affecting unspecified side: Secondary | ICD-10-CM | POA: Diagnosis not present

## 2013-05-06 DIAGNOSIS — I4891 Unspecified atrial fibrillation: Secondary | ICD-10-CM | POA: Diagnosis not present

## 2013-05-06 DIAGNOSIS — I69991 Dysphagia following unspecified cerebrovascular disease: Secondary | ICD-10-CM | POA: Diagnosis not present

## 2013-05-06 DIAGNOSIS — F319 Bipolar disorder, unspecified: Secondary | ICD-10-CM | POA: Diagnosis not present

## 2013-05-06 DIAGNOSIS — I129 Hypertensive chronic kidney disease with stage 1 through stage 4 chronic kidney disease, or unspecified chronic kidney disease: Secondary | ICD-10-CM | POA: Diagnosis not present

## 2013-05-07 DIAGNOSIS — I10 Essential (primary) hypertension: Secondary | ICD-10-CM | POA: Diagnosis not present

## 2013-05-07 DIAGNOSIS — E785 Hyperlipidemia, unspecified: Secondary | ICD-10-CM | POA: Diagnosis not present

## 2013-05-07 DIAGNOSIS — F411 Generalized anxiety disorder: Secondary | ICD-10-CM | POA: Diagnosis not present

## 2013-05-07 DIAGNOSIS — I6350 Cerebral infarction due to unspecified occlusion or stenosis of unspecified cerebral artery: Secondary | ICD-10-CM | POA: Diagnosis not present

## 2013-05-07 DIAGNOSIS — I129 Hypertensive chronic kidney disease with stage 1 through stage 4 chronic kidney disease, or unspecified chronic kidney disease: Secondary | ICD-10-CM | POA: Diagnosis not present

## 2013-05-07 DIAGNOSIS — I4891 Unspecified atrial fibrillation: Secondary | ICD-10-CM | POA: Diagnosis not present

## 2013-05-07 DIAGNOSIS — I69939 Monoplegia of upper limb following unspecified cerebrovascular disease affecting unspecified side: Secondary | ICD-10-CM | POA: Diagnosis not present

## 2013-05-07 DIAGNOSIS — F319 Bipolar disorder, unspecified: Secondary | ICD-10-CM | POA: Diagnosis not present

## 2013-05-07 DIAGNOSIS — I69991 Dysphagia following unspecified cerebrovascular disease: Secondary | ICD-10-CM | POA: Diagnosis not present

## 2013-05-11 DIAGNOSIS — I69939 Monoplegia of upper limb following unspecified cerebrovascular disease affecting unspecified side: Secondary | ICD-10-CM | POA: Diagnosis not present

## 2013-05-11 DIAGNOSIS — F319 Bipolar disorder, unspecified: Secondary | ICD-10-CM | POA: Diagnosis not present

## 2013-05-11 DIAGNOSIS — I129 Hypertensive chronic kidney disease with stage 1 through stage 4 chronic kidney disease, or unspecified chronic kidney disease: Secondary | ICD-10-CM | POA: Diagnosis not present

## 2013-05-11 DIAGNOSIS — I69991 Dysphagia following unspecified cerebrovascular disease: Secondary | ICD-10-CM | POA: Diagnosis not present

## 2013-05-11 DIAGNOSIS — I4891 Unspecified atrial fibrillation: Secondary | ICD-10-CM | POA: Diagnosis not present

## 2013-05-12 DIAGNOSIS — I4891 Unspecified atrial fibrillation: Secondary | ICD-10-CM | POA: Diagnosis not present

## 2013-05-12 DIAGNOSIS — I69939 Monoplegia of upper limb following unspecified cerebrovascular disease affecting unspecified side: Secondary | ICD-10-CM | POA: Diagnosis not present

## 2013-05-12 DIAGNOSIS — F319 Bipolar disorder, unspecified: Secondary | ICD-10-CM | POA: Diagnosis not present

## 2013-05-12 DIAGNOSIS — I69991 Dysphagia following unspecified cerebrovascular disease: Secondary | ICD-10-CM | POA: Diagnosis not present

## 2013-05-12 DIAGNOSIS — I129 Hypertensive chronic kidney disease with stage 1 through stage 4 chronic kidney disease, or unspecified chronic kidney disease: Secondary | ICD-10-CM | POA: Diagnosis not present

## 2013-05-14 DIAGNOSIS — I4891 Unspecified atrial fibrillation: Secondary | ICD-10-CM | POA: Diagnosis not present

## 2013-05-14 DIAGNOSIS — I69991 Dysphagia following unspecified cerebrovascular disease: Secondary | ICD-10-CM | POA: Diagnosis not present

## 2013-05-14 DIAGNOSIS — I129 Hypertensive chronic kidney disease with stage 1 through stage 4 chronic kidney disease, or unspecified chronic kidney disease: Secondary | ICD-10-CM | POA: Diagnosis not present

## 2013-05-14 DIAGNOSIS — I69939 Monoplegia of upper limb following unspecified cerebrovascular disease affecting unspecified side: Secondary | ICD-10-CM | POA: Diagnosis not present

## 2013-05-14 DIAGNOSIS — F319 Bipolar disorder, unspecified: Secondary | ICD-10-CM | POA: Diagnosis not present

## 2013-05-16 DIAGNOSIS — I129 Hypertensive chronic kidney disease with stage 1 through stage 4 chronic kidney disease, or unspecified chronic kidney disease: Secondary | ICD-10-CM | POA: Diagnosis not present

## 2013-05-16 DIAGNOSIS — I69939 Monoplegia of upper limb following unspecified cerebrovascular disease affecting unspecified side: Secondary | ICD-10-CM | POA: Diagnosis not present

## 2013-05-16 DIAGNOSIS — I69991 Dysphagia following unspecified cerebrovascular disease: Secondary | ICD-10-CM | POA: Diagnosis not present

## 2013-05-16 DIAGNOSIS — F319 Bipolar disorder, unspecified: Secondary | ICD-10-CM | POA: Diagnosis not present

## 2013-05-16 DIAGNOSIS — I4891 Unspecified atrial fibrillation: Secondary | ICD-10-CM | POA: Diagnosis not present

## 2013-05-16 DIAGNOSIS — N183 Chronic kidney disease, stage 3 unspecified: Secondary | ICD-10-CM | POA: Diagnosis not present

## 2013-05-20 DIAGNOSIS — I4891 Unspecified atrial fibrillation: Secondary | ICD-10-CM | POA: Diagnosis not present

## 2013-05-20 DIAGNOSIS — I69991 Dysphagia following unspecified cerebrovascular disease: Secondary | ICD-10-CM | POA: Diagnosis not present

## 2013-05-20 DIAGNOSIS — I69939 Monoplegia of upper limb following unspecified cerebrovascular disease affecting unspecified side: Secondary | ICD-10-CM | POA: Diagnosis not present

## 2013-05-20 DIAGNOSIS — I129 Hypertensive chronic kidney disease with stage 1 through stage 4 chronic kidney disease, or unspecified chronic kidney disease: Secondary | ICD-10-CM | POA: Diagnosis not present

## 2013-05-20 DIAGNOSIS — F319 Bipolar disorder, unspecified: Secondary | ICD-10-CM | POA: Diagnosis not present

## 2013-05-20 DIAGNOSIS — N183 Chronic kidney disease, stage 3 unspecified: Secondary | ICD-10-CM | POA: Diagnosis not present

## 2013-05-21 DIAGNOSIS — F319 Bipolar disorder, unspecified: Secondary | ICD-10-CM | POA: Diagnosis not present

## 2013-05-21 DIAGNOSIS — I4891 Unspecified atrial fibrillation: Secondary | ICD-10-CM | POA: Diagnosis not present

## 2013-05-21 DIAGNOSIS — I129 Hypertensive chronic kidney disease with stage 1 through stage 4 chronic kidney disease, or unspecified chronic kidney disease: Secondary | ICD-10-CM | POA: Diagnosis not present

## 2013-05-21 DIAGNOSIS — I69991 Dysphagia following unspecified cerebrovascular disease: Secondary | ICD-10-CM | POA: Diagnosis not present

## 2013-05-21 DIAGNOSIS — N183 Chronic kidney disease, stage 3 unspecified: Secondary | ICD-10-CM | POA: Diagnosis not present

## 2013-05-21 DIAGNOSIS — I69939 Monoplegia of upper limb following unspecified cerebrovascular disease affecting unspecified side: Secondary | ICD-10-CM | POA: Diagnosis not present

## 2013-05-22 DIAGNOSIS — N183 Chronic kidney disease, stage 3 unspecified: Secondary | ICD-10-CM | POA: Diagnosis not present

## 2013-05-22 DIAGNOSIS — I69991 Dysphagia following unspecified cerebrovascular disease: Secondary | ICD-10-CM | POA: Diagnosis not present

## 2013-05-22 DIAGNOSIS — I4891 Unspecified atrial fibrillation: Secondary | ICD-10-CM | POA: Diagnosis not present

## 2013-05-22 DIAGNOSIS — I69939 Monoplegia of upper limb following unspecified cerebrovascular disease affecting unspecified side: Secondary | ICD-10-CM | POA: Diagnosis not present

## 2013-05-22 DIAGNOSIS — I129 Hypertensive chronic kidney disease with stage 1 through stage 4 chronic kidney disease, or unspecified chronic kidney disease: Secondary | ICD-10-CM | POA: Diagnosis not present

## 2013-05-22 DIAGNOSIS — F319 Bipolar disorder, unspecified: Secondary | ICD-10-CM | POA: Diagnosis not present

## 2013-05-23 DIAGNOSIS — F319 Bipolar disorder, unspecified: Secondary | ICD-10-CM | POA: Diagnosis not present

## 2013-05-23 DIAGNOSIS — I129 Hypertensive chronic kidney disease with stage 1 through stage 4 chronic kidney disease, or unspecified chronic kidney disease: Secondary | ICD-10-CM | POA: Diagnosis not present

## 2013-05-23 DIAGNOSIS — I4891 Unspecified atrial fibrillation: Secondary | ICD-10-CM | POA: Diagnosis not present

## 2013-05-23 DIAGNOSIS — N183 Chronic kidney disease, stage 3 unspecified: Secondary | ICD-10-CM | POA: Diagnosis not present

## 2013-05-23 DIAGNOSIS — I69991 Dysphagia following unspecified cerebrovascular disease: Secondary | ICD-10-CM | POA: Diagnosis not present

## 2013-05-23 DIAGNOSIS — I69939 Monoplegia of upper limb following unspecified cerebrovascular disease affecting unspecified side: Secondary | ICD-10-CM | POA: Diagnosis not present

## 2013-05-26 DIAGNOSIS — I69991 Dysphagia following unspecified cerebrovascular disease: Secondary | ICD-10-CM | POA: Diagnosis not present

## 2013-05-26 DIAGNOSIS — I129 Hypertensive chronic kidney disease with stage 1 through stage 4 chronic kidney disease, or unspecified chronic kidney disease: Secondary | ICD-10-CM | POA: Diagnosis not present

## 2013-05-26 DIAGNOSIS — F319 Bipolar disorder, unspecified: Secondary | ICD-10-CM | POA: Diagnosis not present

## 2013-05-26 DIAGNOSIS — N183 Chronic kidney disease, stage 3 unspecified: Secondary | ICD-10-CM | POA: Diagnosis not present

## 2013-05-26 DIAGNOSIS — I69939 Monoplegia of upper limb following unspecified cerebrovascular disease affecting unspecified side: Secondary | ICD-10-CM | POA: Diagnosis not present

## 2013-05-26 DIAGNOSIS — I4891 Unspecified atrial fibrillation: Secondary | ICD-10-CM | POA: Diagnosis not present

## 2013-05-27 DIAGNOSIS — N183 Chronic kidney disease, stage 3 unspecified: Secondary | ICD-10-CM | POA: Diagnosis not present

## 2013-05-27 DIAGNOSIS — I69991 Dysphagia following unspecified cerebrovascular disease: Secondary | ICD-10-CM | POA: Diagnosis not present

## 2013-05-27 DIAGNOSIS — F319 Bipolar disorder, unspecified: Secondary | ICD-10-CM | POA: Diagnosis not present

## 2013-05-27 DIAGNOSIS — I4891 Unspecified atrial fibrillation: Secondary | ICD-10-CM | POA: Diagnosis not present

## 2013-05-27 DIAGNOSIS — I69939 Monoplegia of upper limb following unspecified cerebrovascular disease affecting unspecified side: Secondary | ICD-10-CM | POA: Diagnosis not present

## 2013-05-27 DIAGNOSIS — I129 Hypertensive chronic kidney disease with stage 1 through stage 4 chronic kidney disease, or unspecified chronic kidney disease: Secondary | ICD-10-CM | POA: Diagnosis not present

## 2013-05-28 DIAGNOSIS — I69991 Dysphagia following unspecified cerebrovascular disease: Secondary | ICD-10-CM | POA: Diagnosis not present

## 2013-05-28 DIAGNOSIS — I69939 Monoplegia of upper limb following unspecified cerebrovascular disease affecting unspecified side: Secondary | ICD-10-CM | POA: Diagnosis not present

## 2013-05-28 DIAGNOSIS — N183 Chronic kidney disease, stage 3 unspecified: Secondary | ICD-10-CM | POA: Diagnosis not present

## 2013-05-28 DIAGNOSIS — F319 Bipolar disorder, unspecified: Secondary | ICD-10-CM | POA: Diagnosis not present

## 2013-05-28 DIAGNOSIS — I4891 Unspecified atrial fibrillation: Secondary | ICD-10-CM | POA: Diagnosis not present

## 2013-05-28 DIAGNOSIS — I129 Hypertensive chronic kidney disease with stage 1 through stage 4 chronic kidney disease, or unspecified chronic kidney disease: Secondary | ICD-10-CM | POA: Diagnosis not present

## 2013-05-29 DIAGNOSIS — I69939 Monoplegia of upper limb following unspecified cerebrovascular disease affecting unspecified side: Secondary | ICD-10-CM | POA: Diagnosis not present

## 2013-05-29 DIAGNOSIS — N183 Chronic kidney disease, stage 3 unspecified: Secondary | ICD-10-CM | POA: Diagnosis not present

## 2013-05-29 DIAGNOSIS — I129 Hypertensive chronic kidney disease with stage 1 through stage 4 chronic kidney disease, or unspecified chronic kidney disease: Secondary | ICD-10-CM | POA: Diagnosis not present

## 2013-05-29 DIAGNOSIS — I69991 Dysphagia following unspecified cerebrovascular disease: Secondary | ICD-10-CM | POA: Diagnosis not present

## 2013-05-29 DIAGNOSIS — F319 Bipolar disorder, unspecified: Secondary | ICD-10-CM | POA: Diagnosis not present

## 2013-05-29 DIAGNOSIS — I4891 Unspecified atrial fibrillation: Secondary | ICD-10-CM | POA: Diagnosis not present

## 2013-05-30 DIAGNOSIS — F319 Bipolar disorder, unspecified: Secondary | ICD-10-CM | POA: Diagnosis not present

## 2013-05-30 DIAGNOSIS — I129 Hypertensive chronic kidney disease with stage 1 through stage 4 chronic kidney disease, or unspecified chronic kidney disease: Secondary | ICD-10-CM | POA: Diagnosis not present

## 2013-05-30 DIAGNOSIS — N183 Chronic kidney disease, stage 3 unspecified: Secondary | ICD-10-CM | POA: Diagnosis not present

## 2013-05-30 DIAGNOSIS — I69991 Dysphagia following unspecified cerebrovascular disease: Secondary | ICD-10-CM | POA: Diagnosis not present

## 2013-05-30 DIAGNOSIS — I4891 Unspecified atrial fibrillation: Secondary | ICD-10-CM | POA: Diagnosis not present

## 2013-05-30 DIAGNOSIS — I69939 Monoplegia of upper limb following unspecified cerebrovascular disease affecting unspecified side: Secondary | ICD-10-CM | POA: Diagnosis not present

## 2013-07-22 DIAGNOSIS — Z8673 Personal history of transient ischemic attack (TIA), and cerebral infarction without residual deficits: Secondary | ICD-10-CM | POA: Diagnosis not present

## 2013-07-22 DIAGNOSIS — E785 Hyperlipidemia, unspecified: Secondary | ICD-10-CM | POA: Diagnosis not present

## 2013-07-22 DIAGNOSIS — I10 Essential (primary) hypertension: Secondary | ICD-10-CM | POA: Diagnosis not present

## 2013-07-22 DIAGNOSIS — Z79899 Other long term (current) drug therapy: Secondary | ICD-10-CM | POA: Diagnosis not present

## 2013-10-21 DIAGNOSIS — I10 Essential (primary) hypertension: Secondary | ICD-10-CM | POA: Diagnosis not present

## 2013-10-21 DIAGNOSIS — N183 Chronic kidney disease, stage 3 unspecified: Secondary | ICD-10-CM | POA: Diagnosis not present

## 2013-10-21 DIAGNOSIS — Z23 Encounter for immunization: Secondary | ICD-10-CM | POA: Diagnosis not present

## 2013-10-21 DIAGNOSIS — K219 Gastro-esophageal reflux disease without esophagitis: Secondary | ICD-10-CM | POA: Diagnosis not present

## 2013-10-21 DIAGNOSIS — Z8673 Personal history of transient ischemic attack (TIA), and cerebral infarction without residual deficits: Secondary | ICD-10-CM | POA: Diagnosis not present

## 2014-03-01 DIAGNOSIS — Z23 Encounter for immunization: Secondary | ICD-10-CM | POA: Diagnosis not present

## 2014-05-26 DIAGNOSIS — I129 Hypertensive chronic kidney disease with stage 1 through stage 4 chronic kidney disease, or unspecified chronic kidney disease: Secondary | ICD-10-CM | POA: Diagnosis not present

## 2014-05-26 DIAGNOSIS — Z8673 Personal history of transient ischemic attack (TIA), and cerebral infarction without residual deficits: Secondary | ICD-10-CM | POA: Diagnosis not present

## 2014-05-26 DIAGNOSIS — K219 Gastro-esophageal reflux disease without esophagitis: Secondary | ICD-10-CM | POA: Diagnosis not present

## 2014-05-26 DIAGNOSIS — E784 Other hyperlipidemia: Secondary | ICD-10-CM | POA: Diagnosis not present

## 2014-05-26 DIAGNOSIS — N183 Chronic kidney disease, stage 3 (moderate): Secondary | ICD-10-CM | POA: Diagnosis not present

## 2014-05-26 DIAGNOSIS — R634 Abnormal weight loss: Secondary | ICD-10-CM | POA: Diagnosis not present

## 2014-05-26 DIAGNOSIS — Z1389 Encounter for screening for other disorder: Secondary | ICD-10-CM | POA: Diagnosis not present

## 2014-05-26 DIAGNOSIS — F419 Anxiety disorder, unspecified: Secondary | ICD-10-CM | POA: Diagnosis not present

## 2014-10-07 ENCOUNTER — Other Ambulatory Visit (HOSPITAL_COMMUNITY): Payer: Self-pay | Admitting: Internal Medicine

## 2014-10-07 DIAGNOSIS — R1314 Dysphagia, pharyngoesophageal phase: Secondary | ICD-10-CM

## 2014-10-16 ENCOUNTER — Ambulatory Visit (HOSPITAL_COMMUNITY)
Admission: RE | Admit: 2014-10-16 | Discharge: 2014-10-16 | Disposition: A | Discharge status: Home / Self Care | Payer: Medicare Other | Source: Ambulatory Visit | Attending: Internal Medicine | Admitting: Internal Medicine

## 2014-10-16 ENCOUNTER — Ambulatory Visit (HOSPITAL_COMMUNITY): Payer: Medicare Other

## 2014-10-16 DIAGNOSIS — R1310 Dysphagia, unspecified: Secondary | ICD-10-CM | POA: Insufficient documentation

## 2014-10-16 DIAGNOSIS — Z8673 Personal history of transient ischemic attack (TIA), and cerebral infarction without residual deficits: Secondary | ICD-10-CM | POA: Insufficient documentation

## 2014-10-16 DIAGNOSIS — I69931 Monoplegia of upper limb following unspecified cerebrovascular disease affecting right dominant side: Secondary | ICD-10-CM | POA: Diagnosis not present

## 2014-10-16 DIAGNOSIS — R1314 Dysphagia, pharyngoesophageal phase: Secondary | ICD-10-CM

## 2014-10-16 DIAGNOSIS — I69391 Dysphagia following cerebral infarction: Secondary | ICD-10-CM | POA: Diagnosis not present

## 2014-11-30 DIAGNOSIS — N183 Chronic kidney disease, stage 3 (moderate): Secondary | ICD-10-CM | POA: Diagnosis not present

## 2014-11-30 DIAGNOSIS — I129 Hypertensive chronic kidney disease with stage 1 through stage 4 chronic kidney disease, or unspecified chronic kidney disease: Secondary | ICD-10-CM | POA: Diagnosis not present

## 2014-11-30 DIAGNOSIS — Z1389 Encounter for screening for other disorder: Secondary | ICD-10-CM | POA: Diagnosis not present

## 2014-11-30 DIAGNOSIS — R634 Abnormal weight loss: Secondary | ICD-10-CM | POA: Diagnosis not present

## 2014-11-30 DIAGNOSIS — F419 Anxiety disorder, unspecified: Secondary | ICD-10-CM | POA: Diagnosis not present

## 2014-11-30 DIAGNOSIS — K219 Gastro-esophageal reflux disease without esophagitis: Secondary | ICD-10-CM | POA: Diagnosis not present

## 2014-11-30 DIAGNOSIS — R1312 Dysphagia, oropharyngeal phase: Secondary | ICD-10-CM | POA: Diagnosis not present

## 2015-03-23 DIAGNOSIS — Z23 Encounter for immunization: Secondary | ICD-10-CM | POA: Diagnosis not present

## 2015-03-24 DIAGNOSIS — R350 Frequency of micturition: Secondary | ICD-10-CM | POA: Diagnosis not present

## 2015-03-24 DIAGNOSIS — T148 Other injury of unspecified body region: Secondary | ICD-10-CM | POA: Diagnosis not present

## 2015-03-24 DIAGNOSIS — W19XXXA Unspecified fall, initial encounter: Secondary | ICD-10-CM | POA: Diagnosis not present

## 2015-03-27 ENCOUNTER — Inpatient Hospital Stay (HOSPITAL_COMMUNITY)
Admission: EM | Admit: 2015-03-27 | Discharge: 2015-03-31 | DRG: 683 | Disposition: A | Discharge status: Home / Self Care | Payer: Medicare Other | Attending: Internal Medicine | Admitting: Internal Medicine

## 2015-03-27 ENCOUNTER — Emergency Department (HOSPITAL_COMMUNITY): Payer: Medicare Other

## 2015-03-27 ENCOUNTER — Encounter (HOSPITAL_COMMUNITY): Payer: Self-pay | Admitting: *Deleted

## 2015-03-27 DIAGNOSIS — Z7902 Long term (current) use of antithrombotics/antiplatelets: Secondary | ICD-10-CM

## 2015-03-27 DIAGNOSIS — Z7982 Long term (current) use of aspirin: Secondary | ICD-10-CM | POA: Diagnosis not present

## 2015-03-27 DIAGNOSIS — R109 Unspecified abdominal pain: Secondary | ICD-10-CM

## 2015-03-27 DIAGNOSIS — A047 Enterocolitis due to Clostridium difficile: Secondary | ICD-10-CM | POA: Diagnosis present

## 2015-03-27 DIAGNOSIS — E7800 Pure hypercholesterolemia, unspecified: Secondary | ICD-10-CM | POA: Diagnosis present

## 2015-03-27 DIAGNOSIS — D61818 Other pancytopenia: Secondary | ICD-10-CM

## 2015-03-27 DIAGNOSIS — E872 Acidosis, unspecified: Secondary | ICD-10-CM | POA: Insufficient documentation

## 2015-03-27 DIAGNOSIS — I639 Cerebral infarction, unspecified: Secondary | ICD-10-CM | POA: Diagnosis present

## 2015-03-27 DIAGNOSIS — F319 Bipolar disorder, unspecified: Secondary | ICD-10-CM | POA: Diagnosis present

## 2015-03-27 DIAGNOSIS — E860 Dehydration: Secondary | ICD-10-CM | POA: Diagnosis present

## 2015-03-27 DIAGNOSIS — W19XXXA Unspecified fall, initial encounter: Secondary | ICD-10-CM | POA: Diagnosis not present

## 2015-03-27 DIAGNOSIS — R7303 Prediabetes: Secondary | ICD-10-CM | POA: Diagnosis present

## 2015-03-27 DIAGNOSIS — I10 Essential (primary) hypertension: Secondary | ICD-10-CM | POA: Diagnosis present

## 2015-03-27 DIAGNOSIS — D500 Iron deficiency anemia secondary to blood loss (chronic): Secondary | ICD-10-CM

## 2015-03-27 DIAGNOSIS — N183 Chronic kidney disease, stage 3 (moderate): Secondary | ICD-10-CM

## 2015-03-27 DIAGNOSIS — E871 Hypo-osmolality and hyponatremia: Secondary | ICD-10-CM | POA: Diagnosis not present

## 2015-03-27 DIAGNOSIS — S0990XA Unspecified injury of head, initial encounter: Secondary | ICD-10-CM | POA: Diagnosis not present

## 2015-03-27 DIAGNOSIS — R64 Cachexia: Secondary | ICD-10-CM | POA: Diagnosis present

## 2015-03-27 DIAGNOSIS — D509 Iron deficiency anemia, unspecified: Secondary | ICD-10-CM | POA: Diagnosis not present

## 2015-03-27 DIAGNOSIS — R509 Fever, unspecified: Secondary | ICD-10-CM | POA: Diagnosis present

## 2015-03-27 DIAGNOSIS — Z8673 Personal history of transient ischemic attack (TIA), and cerebral infarction without residual deficits: Secondary | ICD-10-CM

## 2015-03-27 DIAGNOSIS — F411 Generalized anxiety disorder: Secondary | ICD-10-CM | POA: Diagnosis present

## 2015-03-27 DIAGNOSIS — F329 Major depressive disorder, single episode, unspecified: Secondary | ICD-10-CM | POA: Diagnosis present

## 2015-03-27 DIAGNOSIS — F32A Depression, unspecified: Secondary | ICD-10-CM | POA: Diagnosis present

## 2015-03-27 DIAGNOSIS — Z79899 Other long term (current) drug therapy: Secondary | ICD-10-CM

## 2015-03-27 DIAGNOSIS — E785 Hyperlipidemia, unspecified: Secondary | ICD-10-CM | POA: Diagnosis present

## 2015-03-27 DIAGNOSIS — A0472 Enterocolitis due to Clostridium difficile, not specified as recurrent: Secondary | ICD-10-CM | POA: Insufficient documentation

## 2015-03-27 DIAGNOSIS — K219 Gastro-esophageal reflux disease without esophagitis: Secondary | ICD-10-CM | POA: Insufficient documentation

## 2015-03-27 DIAGNOSIS — N189 Chronic kidney disease, unspecified: Secondary | ICD-10-CM | POA: Diagnosis present

## 2015-03-27 DIAGNOSIS — F3170 Bipolar disorder, currently in remission, most recent episode unspecified: Secondary | ICD-10-CM

## 2015-03-27 DIAGNOSIS — Z66 Do not resuscitate: Secondary | ICD-10-CM | POA: Diagnosis present

## 2015-03-27 DIAGNOSIS — K4090 Unilateral inguinal hernia, without obstruction or gangrene, not specified as recurrent: Secondary | ICD-10-CM | POA: Diagnosis not present

## 2015-03-27 DIAGNOSIS — A419 Sepsis, unspecified organism: Secondary | ICD-10-CM

## 2015-03-27 DIAGNOSIS — Z87440 Personal history of urinary (tract) infections: Secondary | ICD-10-CM

## 2015-03-27 DIAGNOSIS — R197 Diarrhea, unspecified: Secondary | ICD-10-CM

## 2015-03-27 DIAGNOSIS — R52 Pain, unspecified: Secondary | ICD-10-CM

## 2015-03-27 DIAGNOSIS — R0602 Shortness of breath: Secondary | ICD-10-CM | POA: Diagnosis not present

## 2015-03-27 DIAGNOSIS — N179 Acute kidney failure, unspecified: Secondary | ICD-10-CM | POA: Diagnosis not present

## 2015-03-27 LAB — COMPREHENSIVE METABOLIC PANEL
ALK PHOS: 61 U/L (ref 38–126)
ALT: 20 U/L (ref 14–54)
AST: 35 U/L (ref 15–41)
Albumin: 3.9 g/dL (ref 3.5–5.0)
Anion gap: 9 (ref 5–15)
BUN: 22 mg/dL — AB (ref 6–20)
CALCIUM: 9.7 mg/dL (ref 8.9–10.3)
CHLORIDE: 98 mmol/L — AB (ref 101–111)
CO2: 22 mmol/L (ref 22–32)
CREATININE: 1.23 mg/dL — AB (ref 0.44–1.00)
GFR calc non Af Amer: 38 mL/min — ABNORMAL LOW (ref >60)
GFR, EST AFRICAN AMERICAN: 44 mL/min — AB (ref >60)
Glucose, Bld: 159 mg/dL — ABNORMAL HIGH (ref 65–99)
Potassium: 3.9 mmol/L (ref 3.5–5.1)
Sodium: 129 mmol/L — ABNORMAL LOW (ref 135–145)
Total Bilirubin: 0.5 mg/dL (ref 0.3–1.2)
Total Protein: 7.4 g/dL (ref 6.5–8.1)

## 2015-03-27 LAB — URINALYSIS, ROUTINE W REFLEX MICROSCOPIC
Bilirubin Urine: NEGATIVE
Glucose, UA: NEGATIVE mg/dL
Ketones, ur: NEGATIVE mg/dL
Leukocytes, UA: NEGATIVE
Nitrite: NEGATIVE
PROTEIN: NEGATIVE mg/dL
SPECIFIC GRAVITY, URINE: 1.017 (ref 1.005–1.030)
Urobilinogen, UA: 0.2 mg/dL (ref 0.0–1.0)
pH: 6 (ref 5.0–8.0)

## 2015-03-27 LAB — CBC WITH DIFFERENTIAL/PLATELET
Basophils Absolute: 0 10*3/uL (ref 0.0–0.1)
Basophils Relative: 0 %
EOS ABS: 0 10*3/uL (ref 0.0–0.7)
Eosinophils Relative: 0 %
HCT: 29.8 % — ABNORMAL LOW (ref 36.0–46.0)
HEMOGLOBIN: 10.3 g/dL — AB (ref 12.0–15.0)
LYMPHS ABS: 0.2 10*3/uL — AB (ref 0.7–4.0)
Lymphocytes Relative: 2 %
MCH: 29.9 pg (ref 26.0–34.0)
MCHC: 34.6 g/dL (ref 30.0–36.0)
MCV: 86.6 fL (ref 78.0–100.0)
Monocytes Absolute: 0.2 10*3/uL (ref 0.1–1.0)
Monocytes Relative: 3 %
NEUTROS PCT: 95 %
Neutro Abs: 7.6 10*3/uL (ref 1.7–7.7)
Platelets: 124 10*3/uL — ABNORMAL LOW (ref 150–400)
RBC: 3.44 MIL/uL — ABNORMAL LOW (ref 3.87–5.11)
RDW: 13.7 % (ref 11.5–15.5)
WBC: 7.9 10*3/uL (ref 4.0–10.5)

## 2015-03-27 LAB — SEDIMENTATION RATE: Sed Rate: 44 mm/hr — ABNORMAL HIGH (ref 0–22)

## 2015-03-27 LAB — URINE MICROSCOPIC-ADD ON

## 2015-03-27 LAB — C-REACTIVE PROTEIN: CRP: 7.7 mg/dL — ABNORMAL HIGH (ref <1.0)

## 2015-03-27 LAB — I-STAT CG4 LACTIC ACID, ED
Lactic Acid, Venous: 0.92 mmol/L (ref 0.5–2.0)
Lactic Acid, Venous: 0.52 mmol/L (ref 0.5–2.0)

## 2015-03-27 MED ORDER — CLOPIDOGREL BISULFATE 75 MG PO TABS
75.0000 mg | ORAL_TABLET | Freq: Every day | ORAL | Status: DC
  Administered 2015-03-28 – 2015-03-31 (×4): 75 mg via ORAL
  Filled 2015-03-27 (×4): qty 1

## 2015-03-27 MED ORDER — ONDANSETRON HCL 4 MG PO TABS: 4.0000 mg | ORAL_TABLET | Freq: Four times a day (QID) | ORAL | Status: DC | PRN

## 2015-03-27 MED ORDER — SODIUM CHLORIDE 0.9 % IJ SOLN
3.0000 mL | Freq: Two times a day (BID) | INTRAMUSCULAR | Status: DC
  Administered 2015-03-27 – 2015-03-30 (×5): 3 mL via INTRAVENOUS

## 2015-03-27 MED ORDER — ACETAMINOPHEN 325 MG PO TABS: 325.0000 mg | ORAL_TABLET | Freq: Once | ORAL | Status: DC

## 2015-03-27 MED ORDER — QUETIAPINE FUMARATE 50 MG PO TABS
50.0000 mg | ORAL_TABLET | Freq: Every day | ORAL | Status: DC
Start: 2015-03-27 — End: 2015-03-31
  Administered 2015-03-27 – 2015-03-30 (×3): 50 mg via ORAL
  Filled 2015-03-27 (×2): qty 1

## 2015-03-27 MED ORDER — ACETAMINOPHEN 325 MG PO TABS
650.0000 mg | ORAL_TABLET | Freq: Once | ORAL | Status: AC
  Administered 2015-03-27: 650 mg via ORAL
  Filled 2015-03-27: qty 2

## 2015-03-27 MED ORDER — ONDANSETRON HCL 4 MG/2ML IJ SOLN: 4.0000 mg | Freq: Four times a day (QID) | INTRAMUSCULAR | Status: DC | PRN

## 2015-03-27 MED ORDER — ENOXAPARIN SODIUM 30 MG/0.3ML ~~LOC~~ SOLN
30.0000 mg | SUBCUTANEOUS | Status: DC
Start: 2015-03-27 — End: 2015-03-28
  Administered 2015-03-27: 30 mg via SUBCUTANEOUS
  Filled 2015-03-27: qty 0.3

## 2015-03-27 MED ORDER — ACETAMINOPHEN 325 MG PO TABS
650.0000 mg | ORAL_TABLET | Freq: Four times a day (QID) | ORAL | Status: DC | PRN
  Administered 2015-03-28 (×2): 650 mg via ORAL
  Filled 2015-03-27 (×2): qty 2

## 2015-03-27 MED ORDER — SODIUM CHLORIDE 0.9 % IV SOLN
INTRAVENOUS | Status: DC
  Administered 2015-03-27: 22:00:00 via INTRAVENOUS

## 2015-03-27 MED ORDER — OCUVITE-LUTEIN PO CAPS
1.0000 | ORAL_CAPSULE | Freq: Every day | ORAL | Status: DC
  Administered 2015-03-28 – 2015-03-31 (×4): 1 via ORAL
  Filled 2015-03-27 (×5): qty 1

## 2015-03-27 MED ORDER — SERTRALINE HCL 100 MG PO TABS
100.0000 mg | ORAL_TABLET | Freq: Every day | ORAL | Status: DC
  Administered 2015-03-28 – 2015-03-31 (×4): 100 mg via ORAL
  Filled 2015-03-27 (×6): qty 1

## 2015-03-27 MED ORDER — ATORVASTATIN CALCIUM 10 MG PO TABS
10.0000 mg | ORAL_TABLET | Freq: Every day | ORAL | Status: DC
  Administered 2015-03-28 – 2015-03-30 (×3): 10 mg via ORAL
  Filled 2015-03-27 (×3): qty 1

## 2015-03-27 MED ORDER — SODIUM CHLORIDE 0.9 % IV SOLN
INTRAVENOUS | Status: DC
  Administered 2015-03-27 – 2015-03-28 (×2): via INTRAVENOUS

## 2015-03-27 MED ORDER — PANTOPRAZOLE SODIUM 40 MG PO TBEC
40.0000 mg | DELAYED_RELEASE_TABLET | Freq: Every day | ORAL | Status: DC
  Administered 2015-03-27: 40 mg via ORAL
  Filled 2015-03-27 (×2): qty 1

## 2015-03-27 MED ORDER — LORAZEPAM 0.5 MG PO TABS: 0.2500 mg | ORAL_TABLET | Freq: Every day | ORAL | Status: DC | PRN

## 2015-03-27 MED ORDER — SODIUM CHLORIDE 0.9 % IV BOLUS (SEPSIS)
1000.0000 mL | Freq: Once | INTRAVENOUS | Status: AC
  Administered 2015-03-27: 1000 mL via INTRAVENOUS

## 2015-03-27 MED ORDER — ACETAMINOPHEN 650 MG RE SUPP: 650.0000 mg | Freq: Four times a day (QID) | RECTAL | Status: DC | PRN

## 2015-03-27 MED ORDER — DOCUSATE SODIUM 50 MG PO CAPS
50.0000 mg | ORAL_CAPSULE | Freq: Every day | ORAL | Status: DC
  Filled 2015-03-27: qty 1

## 2015-03-27 NOTE — ED Notes (Signed)
Patient returned from Radiology. 

## 2015-03-27 NOTE — ED Provider Notes (Signed)
Arrival Date & Time: 03/27/15 & 1522 History   Chief Complaint  Patient presents with  . Urinary Tract Infection  . Fall   HPI [name] is a 81 y.o. female who presents after patient was diagnosed with urinary tract infection due to foul-smelling urine on Wednesday of this week. Patient had fall that on Wednesday and daughter was concerned that patient had urinary tract infection therefore she was taken to primary care office where urine showed positive urinary tract infection and patient was placed on Cipro. They called yesterday and notified the daughter the patient's urine was resistant to Cipro and patient was switched from Cipro to Bactrim yesterday and has remained on Bactrim however is doing worse at this time. Patient's ability to ambulate home has declined and patient is no longer able to get out of bed. Patient is not hungry and has lost appetite altogether patient also feels very unsteady on her feet and has been mild altered mental status. Patient has had diarrhea for one day.   Past Medical History  I reviewed & agree with nursing's documentation of PMHx, PSHx, SHx & FHx. Past Medical History  Diagnosis Date  . Stroke ([place])   . Bipolar disorder ([place])   . Hypertension   . Depression   . Hypercholesteremia    History reviewed. No pertinent past surgical history. Social History   Social History  . Marital Status: Single    Spouse Name: N/A  . Number of Children: N/A  . Years of Education: N/A   Social History Main Topics  . Smoking status: Never Smoker   . Smokeless tobacco: None  . Alcohol Use: No  . Drug Use: No  . Sexual Activity: Not Asked   Other Topics Concern  . None   Social History Narrative   History reviewed. No pertinent family history.  Review of Systems   Complete Review of Systems obtained and is negative except as stated in HPI.  Allergies  Review of patient's allergies indicates no known allergies.  Home Medications   Prior to  Admission medications   Medication Sig Start Date End Date Taking? Authorizing Provider  aspirin EC 81 MG EC tablet Take 1 tablet (81 mg total) by mouth daily. 04/24/13   Lavone Orn, MD  atorvastatin (LIPITOR) 10 MG tablet Take 10 mg by mouth daily.    Historical Provider, MD  clopidogrel (PLAVIX) 75 MG tablet Take 75 mg by mouth daily with breakfast.    Historical Provider, MD  docusate sodium (COLACE) 50 MG capsule Take 50 mg by mouth daily.    Historical Provider, MD  LORazepam (ATIVAN) 0.5 MG tablet Take 0.25 mg by mouth daily as needed for anxiety (pt take a 1/4 of a 0.5 tablet).    Historical Provider, MD  Multiple Vitamins-Minerals (MULTI COMPLETE PO) Take 1 tablet by mouth daily.    Historical Provider, MD  Omeprazole (PRILOSEC PO) Take 1 tablet by mouth 2 (two) times daily.    Historical Provider, MD  QUEtiapine (SEROQUEL) 50 MG tablet Take 50 mg by mouth at bedtime. Pt can only use brand name drug    Historical Provider, MD  sertraline (ZOLOFT) 100 MG tablet Take 100 mg by mouth daily.    Historical Provider, MD    Physical Exam  BP 131/74 mmHg  Pulse 105  Temp(Src) 99.1 F (37.3 C) (Oral)  Resp 18  SpO2 100% Physical Exam  Constitutional: She is oriented to person, place, and time. She appears cachectic.  Non-toxic appearance.  She appears ill. She appears distressed.  HENT:  Head: Normocephalic and atraumatic.  Right Ear: External ear normal.  Left Ear: External ear normal.  Eyes: Pupils are equal, round, and reactive to light. No scleral icterus.  Neck: Normal range of motion. Neck supple. No tracheal deviation present.  No midline ttp.  Cardiovascular: Normal heart sounds and intact distal pulses.   No murmur heard. Pulmonary/Chest: Effort normal and breath sounds normal. No stridor. No respiratory distress. She has no wheezes. She has no rales. She exhibits tenderness (bilaterally along the right lower rib costal margin.).  Abdominal: Soft. Bowel sounds are normal. She  exhibits no distension. There is no tenderness (suprapubic ttp). There is no rebound and no guarding.  Musculoskeletal: Normal range of motion. She exhibits no tenderness.  Neurological: She is alert and oriented to person, place, and time. She has normal strength and normal reflexes. No cranial nerve deficit or sensory deficit.  Skin: Skin is warm and dry. No pallor.  Patient has scattered superficial hematomas of bilateral wrist and right hip without skin tears or open wound or abrasion or lacerations.  Psychiatric: She has a normal mood and affect. Her behavior is normal.  Nursing note and vitals reviewed.  ED Course  Procedures  Labs Review Labs Reviewed  CULTURE, BLOOD (ROUTINE X 2)  CULTURE, BLOOD (ROUTINE X 2)  URINE CULTURE  COMPREHENSIVE METABOLIC PANEL  CBC WITH DIFFERENTIAL/PLATELET  URINALYSIS, ROUTINE W REFLEX MICROSCOPIC (NOT AT [facility])  I-STAT CG4 LACTIC ACID, ED   Imaging Review No results found.  Laboratory and Imaging results were personally reviewed by myself and used in the medical decision making of this patient's treatment and disposition.  EKG Interpretation  EKG Interpretation  Date/Time:    Ventricular Rate:    PR Interval:    QRS Duration:   QT Interval:    QTC Calculation:   R Axis:     Text Interpretation:        MDM  [name] is a 81 y.o. female with H&P as above. ED clinical course as follows:  Patient will receive CT head without contrast due to patient on Eliquis MI review of imaging reveals NAICA. CXR reveals no acute cardiopulmonary abdomen maladies or evidence of acute fracture.  CBC and CMP remarkable for no leukocytosis however patient does have AKI  without history of chronic kidney disease. Do not suspect sepsis as patient is without altered mental status or hypotension and lactic acid within normal limits at 0.9.  Given suprapubic tenderness and known urinary tract infection diagnosed per urine this week with resistance to prior  antibiotic patient will require inpatient admission for continuation of treatment of known UTI and management and evaluation of acute kidney injury. Do not suspect traumatic fracture of the extremities and extremity's are neurovascularly intact and patient requires no update of tetanus or wound repair as her no open wounds at this time. Patient has no neurologic findings do not suspect occult intracranial or other neurologic or traumatic injuries. Patient is hematuria immediately stable and appropriate for admission at this time.  Disposition: Admit  Clinical Impression: No diagnosis found. Patient care discussed with Dr. Tammy Sours, who oversaw their evaluation & treatment & voiced agreement. House Officer: Voncille Lo, MD, Emergency Medicine.  Voncille Lo, MD 03/28/15 [id number]  Varney Biles, MD 03/29/15 0001

## 2015-03-27 NOTE — ED Notes (Signed)
Per family, recent treatment for UTI. Was started on cipro but called yesterday to get it changed to bactrim due to resistance. Started bactrim last night. Family reports pt having frequent falls, lack of appetite and fever.

## 2015-03-27 NOTE — H&P (Addendum)
[facility] History and Physical  [name] QTM:[id number] DOB: 12/09/1923 DOA: 03/27/2015  Referring physician: ED PCP: Irven Shelling, MD   Chief Complaint: Falls and fever  HPI:  Patient is a 81 year old female with a past medical history significant for hypertension, hyperlipidemia, CVA of the left basal ganglia, bipolar, depression; who presents with complaints of falls and fever of 102F. History is obtained from the patient and patient's daughter-in-law. 4 days ago the patient had been incontinent of urine which she usually never does. The following morning she had a fall and when she went to use the bathroom her urine was noted to be  dark cloudy with a foul odor. Family immediately had her scheduled with appointment with her PCP in the same day. At his office she was diagnosed with a UTI and started on ciprofloxacin. Patient took 2 doses of ciprofloxacin.  However, there were called by the patient's doctor's office on Friday as cultures showed that the bacteria found was resistant to ciprofloxacin and she was started on Bactrim which was thought to be sensitive. Patient took 2 doses of Bactrim and this morning had a another fall against the door sliding down. Family notes with the falls and they did not think that the patient lost consciousness or had any significant trauma to her head. After falls patient complained of pain around the lower rib cage. Patient able to move all extremities although has bruises on her right shoulder, elbow,  and hip.  Family notes today that she started developing a fever that went as high as 102.13F at home(these may have taken rectally as patient has no teeth).  associated symptoms of a loose stool this morning and poor appetite over the last 1-2 days.   Review of Systems  Constitutional: Positive for fever. Negative for weight loss.  HENT: Positive for hearing loss. Negative for ear pain.   Eyes: Negative for double vision and photophobia.   Respiratory: Negative for cough and hemoptysis.   Cardiovascular: Negative for palpitations and leg swelling.  Gastrointestinal: Positive for diarrhea. Negative for nausea and vomiting.  Genitourinary: Positive for urgency and frequency. Negative for hematuria.  Musculoskeletal: Positive for joint pain and falls.  Neurological: Positive for weakness. Negative for speech change, focal weakness and headaches.  Endo/Heme/Allergies: Negative for polydipsia. Bruises/bleeds easily.  Psychiatric/Behavioral: Negative for substance abuse. The patient is nervous/anxious.      Past Medical History  Diagnosis Date  . Stroke ([place])   . Bipolar disorder ([place])   . Hypertension   . Depression   . Hypercholesteremia      History reviewed. No pertinent past surgical history.    Social History:  reports that she has never smoked. She does not have any smokeless tobacco history on file. She reports that she does not drink alcohol or use illicit drugs. where does patient live--home   Can patient participate in ADLs? yes  No Known Allergies  History reviewed. No pertinent family history.     Prior to Admission medications   Medication Sig Start Date End Date Taking? Authorizing Provider  aspirin EC 81 MG EC tablet Take 1 tablet (81 mg total) by mouth daily. 04/24/13   Lavone Orn, MD  atorvastatin (LIPITOR) 10 MG tablet Take 10 mg by mouth daily.    Historical Provider, MD  clopidogrel (PLAVIX) 75 MG tablet Take 75 mg by mouth daily with breakfast.    Historical Provider, MD  docusate sodium (COLACE) 50 MG capsule Take 50 mg by mouth daily.  Historical Provider, MD  LORazepam (ATIVAN) 0.5 MG tablet Take 0.25 mg by mouth daily as needed for anxiety (pt take a 1/4 of a 0.5 tablet).    Historical Provider, MD  Multiple Vitamins-Minerals (MULTI COMPLETE PO) Take 1 tablet by mouth daily.    Historical Provider, MD  Omeprazole (PRILOSEC PO) Take 1 tablet by mouth 2 (two) times daily.    Historical  Provider, MD  QUEtiapine (SEROQUEL) 50 MG tablet Take 50 mg by mouth at bedtime. Pt can only use brand name drug    Historical Provider, MD  sertraline (ZOLOFT) 100 MG tablet Take 100 mg by mouth daily.    Historical Provider, MD     Physical Exam: Filed Vitals:   03/27/15 1900 03/27/15 1930 03/27/15 1932 03/27/15 2000  BP: 140/59 146/67  126/51  Pulse: 90 94  82  Temp:   101.7 F (38.7 C)   TempSrc:   Rectal   Resp: $Remo'23 27  22  'cWFvX$ Weight:      SpO2: 95% 93%  94%     Constitutional: Vital signs reviewed. Patient is a thin elderly female in no acute distress and cooperative with exam. Patient is hard of hearing. Alert and oriented x3.  Head: Normocephalic and atraumatic  Ear: TM normal bilaterally  Mouth: no erythema or exudates, dry mucous membranes Eyes: PERRL, EOMI, conjunctivae normal, No scleral icterus.  Neck: Supple, Trachea midline normal ROM, No JVD, mass, thyromegaly, or carotid bruit present.  Cardiovascular: RRR, S1 normal, S2 normal, no MRG, pulses symmetric and intact bilaterally  Pulmonary/Chest: CTAB, no wheezes, rales, or rhonchi  Abdominal: Soft. Non-tender, non-distended, bowel sounds are normal, no masses, organomegaly, or guarding present.  GU: no CVA tenderness Musculoskeletal:  No joint deformities, erythema, or stiffness, ROM full. There is some mild tenderness to palpation over areas that are bruised  Ext: no edema and no cyanosis, pulses palpable bilaterally (DP and PT)  Hematology: no cervical, inginal, or axillary adenopathy.  Neurological: A&O x3, Strenght is normal and symmetric bilaterally, cranial nerve II-XII are grossly intact, no focal motor deficit, sensory intact to light touch bilaterally.  Skin: Bruises to the L. hip, R. hip, R. Elbow, R. shoulder. Otherwise skin is warm, dry and intact. No rash, cyanosis, or clubbing.  Psychiatric: Normal mood and affect. speech and behavior is normal. Judgment and thought content normal. Cognition and memory are  normal.      Data Review   Micro Results No results found for this or any previous visit (from the past 240 hour(s)).  Radiology Reports Dg Chest 2 View  03/27/2015  CLINICAL DATA:  Status post fall 03/24/2015 and 03/26/2015. Shortness of breath today. Initial encounter. EXAM: CHEST  2 VIEW COMPARISON:  PA and lateral chest 07/28/2009. FINDINGS: Mild subsegmental atelectasis is seen in the left lung base. The lungs are otherwise clear. Heart size is normal. No focal bony abnormality is identified. IMPRESSION: No acute disease. Electronically Signed   By: Inge Rise M.D.   On: 03/27/2015 18:28   Ct Head Wo Contrast  03/27/2015  CLINICAL DATA:  FALL Family reports pt having frequent falls, lack of appetite and fever. PMH: Stroke [facility]); Bipolar disorder ([place]); Depression; Hypercholesteremia EXAM: CT HEAD WITHOUT CONTRAST TECHNIQUE: Contiguous axial images were obtained from the base of the skull through the vertex without intravenous contrast. COMPARISON:  04/22/2013 CT, 04/22/2013 MRI FINDINGS: There is an old lacunar infarct of the left basal ganglia. Significant periventricular white matter change identified consistent with small vessel disease. There  is mild central and cortical atrophy. There is no intra or extra-axial fluid collection or mass lesion. The basilar cisterns and ventricles have a normal appearance. There is no CT evidence for acute infarction or hemorrhage. No acute abnormality identified on bone windows. There is significant atherosclerotic calcification of the internal carotid arteries. IMPRESSION: 1.  No evidence for acute intracranial abnormality. 2. Atrophy and small vessel disease. 3. Chronic left basal ganglia infarct. Electronically Signed   By: Nolon Nations M.D.   On: 03/27/2015 18:28     CBC  Recent Labs Lab 03/27/15 1603  WBC 7.9  HGB 10.3*  HCT 29.8*  PLT 124*  MCV 86.6  MCH 29.9  MCHC 34.6  RDW 13.7  LYMPHSABS 0.2*  MONOABS 0.2  EOSABS 0.0   BASOSABS 0.0    Chemistries   Recent Labs Lab 03/27/15 1603  NA 129*  K 3.9  CL 98*  CO2 22  GLUCOSE 159*  BUN 22*  CREATININE 1.23*  CALCIUM 9.7  AST 35  ALT 20  ALKPHOS 61  BILITOT 0.5   ------------------------------------------------------------------------------------------------------------------ CrCl cannot be calculated (Unknown ideal weight.). ------------------------------------------------------------------------------------------------------------------ No results for input(s): HGBA1C in the last 72 hours. ------------------------------------------------------------------------------------------------------------------ No results for input(s): CHOL, HDL, LDLCALC, TRIG, CHOLHDL, LDLDIRECT in the last 72 hours. ------------------------------------------------------------------------------------------------------------------ No results for input(s): TSH, T4TOTAL, T3FREE, THYROIDAB in the last 72 hours.  Invalid input(s): FREET3 ------------------------------------------------------------------------------------------------------------------ No results for input(s): VITAMINB12, FOLATE, FERRITIN, TIBC, IRON, RETICCTPCT in the last 72 hours.  Coagulation profile No results for input(s): INR, PROTIME in the last 168 hours.  No results for input(s): DDIMER in the last 72 hours.  Cardiac Enzymes No results for input(s): CKMB, TROPONINI, MYOGLOBIN in the last 168 hours.  Invalid input(s): CK ------------------------------------------------------------------------------------------------------------------ Invalid input(s): POCBNP   CBG: No results for input(s): GLUCAP in the last 168 hours.     EKG: Independently reviewed. Normal sinus rhythm with left anterior fascicular block and LVH   Assessment/Plan Principal Problem:   Fever with Hx: UTI (urinary tract infection): Acute. Patient with elevated temperature up to 102F rectally for which they became  concerned that the patient had not been treated adequately after receiving ciprofloxacin and then to doses of Bactrim. WBC count within normal limits with no signs of eosinophilia. UA on admission negative for any signs of infection. Differential diagnosis drug reaction fever versus dehydration versus less likely infection. -Holding off treating with antibiotics as there is no clear source of infection. Discuss with family pros and cons of empirically treating with antibiotics. Family is okay with this decision to monitor as patient states that she feels better after receiving fluids. -Checking esr,crp, ?Urine eosinophils -tylenol prn fever -f/u blood cultures  Hyponatremia Suspect secondary to dehydration : Acute on chronic. Sodiums have been normally in the 130s. Today sodium 129. Suspect tachycardia secondary to patient being dehydrated. -IV fluids 75 mL per hour -Rechecking BMP in a.m. -Patient on telemetry  Possible AKI vs. Chronic kidney disease: Patient with previously normal kidney function as seen by Cr of 1.04 in 2014 exact baseline is unknown. However, acutely worse today Cr 1.23. Patient received 1 L bolus and ED -Checking FeNa -IV fluids  -Recheck BMP in a.m.  Rib pain-patient has tenderness to palpation along the lower rib cage on that. Musculoskeletal chest x-ray does not show any acute fracture -May consider a acute series in a.m. if not improved -Incentive spirometry  Falls: Acute patient having 2 falls on the last few days. Suspect secondary to combination of previous  UTI, medications, dehydration, and baseline gait disturbance -PT to eval and treat in a.m.    Pancytopenia: Patient with hemoglobin and hematocrit of 10.3 and 21.8 respectively along with platelet count of 124 -Recheck in am  Hyperglycemia: Mild patient was noted to be prediabetic back in 2014 with a hemoglobin A1c of 5.8. Suspected symptoms secondary to acute distress -Can possibly check a hemoglobin A1c if  sugars continue to be high -Nurse to look M.D. if persistently rater than cbg of 140  Hypertension: Stable -Nursing care orders to allow patient to use home Azor 10/$RemoveBefo'40mg'[id number]$  as this is not available on our pharmacy and family is nervous to make subsitutions   Hyperlipidemia -Continue home dose of atorvastatin 10 daily at bedtime     Hx of arterial ischemic stroke -Continue Plavix    Anxiety state -Continue prn Ativan     Depression -Continue Zoloft 100 mg  Bipolar disorder (HCC) -Continue Seroquel  -Please allow patient to use home medication as she states it has to be the brand name     DVT prophylaxis: Lovenox although written for GFR less than 30    Code Status:   full Family Communication: bedside Disposition Plan: admit   Total time spent 55 minutes.Greater than 50% of this time was spent in counseling, explanation of diagnosis, planning of further management, and coordination of care  [facility] Pager [phone]  If 7PM-7AM, please contact night-coverage [email or web address] Password [facility] 03/27/2015, 8:39 PM

## 2015-03-27 NOTE — ED Notes (Signed)
Patient transported to Radiology 

## 2015-03-27 NOTE — Progress Notes (Signed)
New Admission Note:   Arrival Method:  stretcher Mental Orientation: alert and oriented to self ,family and place Telemetry: 15 Assessment: see flow sheet Skin:old bruises right and left hip  IV: Pain: Tubes:none Safety Measures: Safety Fall Prevention Plan has been,discussed and yellow slippers placed Admission: [place] Orientation: Patient has been orientated to the room, unit and staff.  Family:daughter at bedside  Orders have been reviewed and implemented. Will continue to monitor the patient. Call light has been placed within reach and bed alarm has been activated.   Amado Coe, RN Phone number: [phone]

## 2015-03-28 ENCOUNTER — Inpatient Hospital Stay (HOSPITAL_COMMUNITY): Payer: Medicare Other

## 2015-03-28 DIAGNOSIS — D500 Iron deficiency anemia secondary to blood loss (chronic): Secondary | ICD-10-CM

## 2015-03-28 DIAGNOSIS — R197 Diarrhea, unspecified: Secondary | ICD-10-CM

## 2015-03-28 LAB — BASIC METABOLIC PANEL
Anion gap: 8 (ref 5–15)
BUN: 17 mg/dL (ref 6–20)
CHLORIDE: 106 mmol/L (ref 101–111)
CO2: 17 mmol/L — ABNORMAL LOW (ref 22–32)
Calcium: 8.2 mg/dL — ABNORMAL LOW (ref 8.9–10.3)
Creatinine, Ser: 1.07 mg/dL — ABNORMAL HIGH (ref 0.44–1.00)
GFR calc Af Amer: 52 mL/min — ABNORMAL LOW (ref >60)
GFR calc non Af Amer: 45 mL/min — ABNORMAL LOW (ref >60)
GLUCOSE: 117 mg/dL — AB (ref 65–99)
POTASSIUM: 3.4 mmol/L — AB (ref 3.5–5.1)
Sodium: 131 mmol/L — ABNORMAL LOW (ref 135–145)

## 2015-03-28 LAB — CBC
HEMATOCRIT: 23.5 % — AB (ref 36.0–46.0)
HEMOGLOBIN: 7.9 g/dL — AB (ref 12.0–15.0)
MCH: 29 pg (ref 26.0–34.0)
MCHC: 33.6 g/dL (ref 30.0–36.0)
MCV: 86.4 fL (ref 78.0–100.0)
Platelets: 103 10*3/uL — ABNORMAL LOW (ref 150–400)
RBC: 2.72 MIL/uL — ABNORMAL LOW (ref 3.87–5.11)
RDW: 14 % (ref 11.5–15.5)
WBC: 4.9 10*3/uL (ref 4.0–10.5)

## 2015-03-28 LAB — CREATININE, URINE, RANDOM: Creatinine, Urine: 56.17 mg/dL

## 2015-03-28 LAB — C DIFFICILE QUICK SCREEN W PCR REFLEX
C DIFFICILE (CDIFF) TOXIN: NEGATIVE
C Diff antigen: POSITIVE — AB

## 2015-03-28 LAB — VITAMIN B12: Vitamin B-12: 1259 pg/mL — ABNORMAL HIGH (ref 180–914)

## 2015-03-28 LAB — RETICULOCYTES
RBC.: 3.57 MIL/uL — AB (ref 3.87–5.11)
RETIC COUNT ABSOLUTE: 42.8 10*3/uL (ref 19.0–186.0)
RETIC CT PCT: 1.2 % (ref 0.4–3.1)

## 2015-03-28 LAB — FOLATE: Folate: 44.7 ng/mL (ref >5.9)

## 2015-03-28 LAB — IRON AND TIBC
Iron: 15 ug/dL — ABNORMAL LOW (ref 28–170)
Saturation Ratios: 4 % — ABNORMAL LOW (ref 10.4–31.8)
TIBC: 353 ug/dL (ref 250–450)
UIBC: 338 ug/dL

## 2015-03-28 LAB — FERRITIN: FERRITIN: 55 ng/mL (ref 11–307)

## 2015-03-28 LAB — SODIUM, URINE, RANDOM: Sodium, Ur: 119 mmol/L

## 2015-03-28 MED ORDER — POTASSIUM CHLORIDE 20 MEQ/15ML (10%) PO SOLN
ORAL | Status: AC
  Filled 2015-03-28: qty 15

## 2015-03-28 MED ORDER — SODIUM CHLORIDE 0.9 % IV BOLUS (SEPSIS): 250.0000 mL | Freq: Once | INTRAVENOUS | Status: DC

## 2015-03-28 MED ORDER — IOHEXOL 300 MG/ML  SOLN
25.0000 mL | INTRAMUSCULAR | Status: AC
  Administered 2015-03-28 (×2): 25 mL via ORAL

## 2015-03-28 MED ORDER — POTASSIUM CHLORIDE CRYS ER 20 MEQ PO TBCR
40.0000 meq | EXTENDED_RELEASE_TABLET | Freq: Once | ORAL | Status: AC
  Administered 2015-03-28: 40 meq via ORAL
  Filled 2015-03-28: qty 2

## 2015-03-28 MED ORDER — SODIUM CHLORIDE 0.9 % IV BOLUS (SEPSIS)
500.0000 mL | Freq: Once | INTRAVENOUS | Status: AC
  Administered 2015-03-28: 500 mL via INTRAVENOUS

## 2015-03-28 MED ORDER — METRONIDAZOLE IN NACL 5-0.79 MG/ML-% IV SOLN
500.0000 mg | Freq: Three times a day (TID) | INTRAVENOUS | Status: DC
  Administered 2015-03-28 – 2015-03-30 (×6): 500 mg via INTRAVENOUS
  Filled 2015-03-28 (×5): qty 100

## 2015-03-28 NOTE — Progress Notes (Addendum)
PT Cancellation Note  Patient Details Name: [name] MRN: [id number] DOB: 07/14/1923   Cancelled Treatment:    Reason Eval/Treat Not Completed: Other (comment)  Pt having frequent stools, sitting on BSC. Family request PT return at a later time. Will check back this afternoon.  Addendum: Checked back with patient this afternoon for PT follow-up. Family states pt resting in bed and requests PT return tomorrow.   [name] 03/28/2015, 11:56 AM  Elayne Snare, [place]

## 2015-03-28 NOTE — Progress Notes (Signed)
Pt. Has a temp of 102. MD notifed. Ordered to give PRN tylenol. Will cont to monitor. Temp rechecked 98.8.

## 2015-03-28 NOTE — Progress Notes (Signed)
[facility] PROGRESS NOTE  [name] [id number] DOB: 06-21-1923 DOA: 03/27/2015 PCP: Irven Shelling, MD  Assessment/Plan: 1-Fever, abdominal pain diarrhea; suspect C diff colitis.  Had recent antibiotics use.  Start Empirically flagyl.  Check GI pathogen and C diff.  Will also order CT scan.  ESR and CRP elevated.   2-metabolic acidosis; - increase IV fluids, IV bolus  -in setting of diarrhea.   3-Recent UTI; received 2 days of Bactrim. UA in the hospital Negative.   4-Frequent fall; will need PT evaluation. Ct head negative.   5- Anemia; will check anemia panel. No evidence of active bleeding. Hold Lovenox. Start SCD/   6-hyponatremia; IV fluids.   7-Possible AKI vs. Chronic kidney disease; in setting diarrhea, poor oral intake. Continue with IV fluids.  Rib pain-patient has tenderness to palpation along the lower rib cage on that. Musculoskeletal chest x-ray does not show any acute fracture -May consider a acute series in a.m. if not improved -Incentive spirometry  Pancytopenia: Patient with hemoglobin and hematocrit of 10.3 and 21.8 respectively along with platelet count of 124 -Recheck in am. Might be related to infectious process.  Anemia panel.   Hx of arterial ischemic stroke -Continue Plavix   Anxiety state -Continue prn Ativan    Depression -Continue Zoloft 100 mg  Bipolar disorder (HCC) -Continue Seroquel  -Please allow patient to use home medication as she states it has to be the brand name    DVT prophylaxis: hold Lovenox due to anemia and thrombocytopenia.   Code Status: per family patient has DNR on file. I have change code status to DNR Family Communication: care discussed with son and daughter in law.  Disposition Plan: remain inpatient. Check for C diff, check CT scan.    Consultants:  none  Procedures:  none  Antibiotics:  Flagyl 11-13  HPI/Subjective: Patient having multiples BM since yesterday.   Complaining of abdominal pain.   Objective: Filed Vitals:   03/28/15 0827  BP: 118/50  Pulse: 72  Temp: 99.8 F (37.7 C)  Resp: 16    Intake/Output Summary (Last 24 hours) at 03/28/15 0906 Last data filed at 03/28/15 7062  Gross per 24 hour  Intake 546.25 ml  Output   1305 ml  Net -758.75 ml   Filed Weights   03/27/15 1629  Weight: 48.308 kg (106 lb 8 oz)    Exam:   General:  NAD  Cardiovascular: S 1, S 2 RRR  Respiratory: CTA  Abdomen: BS present, soft,   Musculoskeletal: no edema  Data Reviewed: Basic Metabolic Panel:  Recent Labs Lab 03/27/15 1603 03/28/15 0450  NA 129* 131*  K 3.9 3.4*  CL 98* 106  CO2 22 17*  GLUCOSE 159* 117*  BUN 22* 17  CREATININE 1.23* 1.07*  CALCIUM 9.7 8.2*   Liver Function Tests:  Recent Labs Lab 03/27/15 1603  AST 35  ALT 20  ALKPHOS 61  BILITOT 0.5  PROT 7.4  ALBUMIN 3.9   No results for input(s): LIPASE, AMYLASE in the last 168 hours. No results for input(s): AMMONIA in the last 168 hours. CBC:  Recent Labs Lab 03/27/15 1603 03/28/15 0450  WBC 7.9 4.9  NEUTROABS 7.6  --   HGB 10.3* 7.9*  HCT 29.8* 23.5*  MCV 86.6 86.4  PLT 124* 103*   Cardiac Enzymes: No results for input(s): CKTOTAL, CKMB, CKMBINDEX, TROPONINI in the last 168 hours. BNP (last 3 results) No results for input(s): BNP in the last 8760 hours.  ProBNP (last  3 results) No results for input(s): PROBNP in the last 8760 hours.  CBG: No results for input(s): GLUCAP in the last 168 hours.  No results found for this or any previous visit (from the past 240 hour(s)).   Studies: Dg Chest 2 View  03/27/2015  CLINICAL DATA:  Status post fall 03/24/2015 and 03/26/2015. Shortness of breath today. Initial encounter. EXAM: CHEST  2 VIEW COMPARISON:  PA and lateral chest 07/28/2009. FINDINGS: Mild subsegmental atelectasis is seen in the left lung base. The lungs are otherwise clear. Heart size is normal. No focal bony abnormality is  identified. IMPRESSION: No acute disease. Electronically Signed   By: Inge Rise M.D.   On: 03/27/2015 18:28   Ct Head Wo Contrast  03/27/2015  CLINICAL DATA:  FALL Family reports pt having frequent falls, lack of appetite and fever. PMH: Stroke [facility]); Bipolar disorder ([place]); Depression; Hypercholesteremia EXAM: CT HEAD WITHOUT CONTRAST TECHNIQUE: Contiguous axial images were obtained from the base of the skull through the vertex without intravenous contrast. COMPARISON:  04/22/2013 CT, 04/22/2013 MRI FINDINGS: There is an old lacunar infarct of the left basal ganglia. Significant periventricular white matter change identified consistent with small vessel disease. There is mild central and cortical atrophy. There is no intra or extra-axial fluid collection or mass lesion. The basilar cisterns and ventricles have a normal appearance. There is no CT evidence for acute infarction or hemorrhage. No acute abnormality identified on bone windows. There is significant atherosclerotic calcification of the internal carotid arteries. IMPRESSION: 1.  No evidence for acute intracranial abnormality. 2. Atrophy and small vessel disease. 3. Chronic left basal ganglia infarct. Electronically Signed   By: Nolon Nations M.D.   On: 03/27/2015 18:28    Scheduled Meds: . atorvastatin  10 mg Oral q1800  . clopidogrel  75 mg Oral Q breakfast  . docusate sodium  50 mg Oral Daily  . enoxaparin (LOVENOX) injection  30 mg Subcutaneous Q24H  . multivitamin-lutein  1 capsule Oral Daily  . pantoprazole  40 mg Oral Daily  . potassium chloride  40 mEq Oral Once  . QUEtiapine  50 mg Oral QHS  . sertraline  100 mg Oral Daily  . sodium chloride  3 mL Intravenous Q12H   Continuous Infusions: . sodium chloride 75 mL/hr at 03/28/15 0400    Principal Problem:   Fever, unspecified Active Problems:   CVA (cerebral infarction)   Hypertension   Hyperlipidemia   Chronic kidney disease   AKI (acute kidney injury) ([place])    Hx of arterial ischemic stroke   Anxiety state   Depression   Bipolar disorder (HCC)   Hyponatremia   Hyponatremia with extracellular fluid depletion   Hx: UTI (urinary tract infection)    Time spent: 35 minutes.     Niel Hummer A  [facility] Pager [phone]. If 7PM-7AM, please contact night-coverage at [email or web address], password [facility] 03/28/2015, 9:06 AM  LOS: 1 day

## 2015-03-29 LAB — BASIC METABOLIC PANEL
Anion gap: 8 (ref 5–15)
BUN: 12 mg/dL (ref 6–20)
CALCIUM: 8.9 mg/dL (ref 8.9–10.3)
CO2: 14 mmol/L — AB (ref 22–32)
CREATININE: 1.01 mg/dL — AB (ref 0.44–1.00)
Chloride: 112 mmol/L — ABNORMAL HIGH (ref 101–111)
GFR calc non Af Amer: 48 mL/min — ABNORMAL LOW (ref >60)
GFR, EST AFRICAN AMERICAN: 55 mL/min — AB (ref >60)
Glucose, Bld: 102 mg/dL — ABNORMAL HIGH (ref 65–99)
Potassium: 3.4 mmol/L — ABNORMAL LOW (ref 3.5–5.1)
SODIUM: 134 mmol/L — AB (ref 135–145)

## 2015-03-29 LAB — CBC
HCT: 27.6 % — ABNORMAL LOW (ref 36.0–46.0)
Hemoglobin: 9.1 g/dL — ABNORMAL LOW (ref 12.0–15.0)
MCH: 29.1 pg (ref 26.0–34.0)
MCHC: 33 g/dL (ref 30.0–36.0)
MCV: 88.2 fL (ref 78.0–100.0)
Platelets: 110 10*3/uL — ABNORMAL LOW (ref 150–400)
RBC: 3.13 MIL/uL — ABNORMAL LOW (ref 3.87–5.11)
RDW: 14.4 % (ref 11.5–15.5)
WBC: 3.7 10*3/uL — ABNORMAL LOW (ref 4.0–10.5)

## 2015-03-29 LAB — HEMOGLOBIN A1C
HEMOGLOBIN A1C: 6.3 % — AB (ref 4.8–5.6)
MEAN PLASMA GLUCOSE: 134 mg/dL

## 2015-03-29 LAB — URINE CULTURE: Culture: 1000

## 2015-03-29 MED ORDER — POTASSIUM CHLORIDE CRYS ER 20 MEQ PO TBCR
40.0000 meq | EXTENDED_RELEASE_TABLET | ORAL | Status: AC
  Administered 2015-03-29 (×2): 40 meq via ORAL
  Filled 2015-03-29 (×2): qty 2

## 2015-03-29 MED ORDER — SODIUM BICARBONATE 8.4 % IV SOLN
INTRAVENOUS | Status: DC
  Administered 2015-03-29: 14:00:00 via INTRAVENOUS
  Filled 2015-03-29 (×3): qty 150

## 2015-03-29 NOTE — Evaluation (Signed)
Physical Therapy Evaluation Patient Details Name: LASHINA BOSHER MRN: [id number] DOB: [birth year] Today's Date: 03/29/2015   History of Present Illness  Patient is a 81 year old female with a past medical history significant for hypertension, hyperlipidemia, CVA of the left basal ganglia, bipolar, depression; who presents with complaints of falls and fever of 102F.   Clinical Impression  Pt with generalized deconditioning and requires min guard and RW for safe mobility and ambulation. Pt was indep PTA. Pt with good home set up and support. Recommend HHPT to address deficits and increase independence with mobility.    Follow Up Recommendations Home health PT;Supervision/Assistance - 24 hour    Equipment Recommendations  None recommended by PT    Recommendations for Other Services       Precautions / Restrictions Precautions Precautions: Fall Restrictions Weight Bearing Restrictions: No      Mobility  Bed Mobility Overal bed mobility: Needs Assistance Bed Mobility: Supine to Sit           General bed mobility comments: increased time  Transfers Overall transfer level: Needs assistance Equipment used: Rolling walker (2 wheeled) Transfers: Sit to/from Stand Sit to Stand: Min guard         General transfer comment: increased time, v/c's for hand placement  Ambulation/Gait Ambulation/Gait assistance: Min guard Ambulation Distance (Feet): 150 Feet Assistive device: Rolling walker (2 wheeled) Gait Pattern/deviations: Step-through pattern;Decreased stride length Gait velocity: slow   General Gait Details: mild instability  Stairs            Wheelchair Mobility    Modified Rankin (Stroke Patients Only)       Balance Overall balance assessment: Needs assistance Sitting-balance support: Feet supported;No upper extremity supported Sitting balance-Leahy Scale: Normal     Standing balance support: No upper extremity supported Standing balance-Leahy Scale:  Poor Standing balance comment: benefits from bilat UE support                             Pertinent Vitals/Pain Pain Assessment: No/denies pain    Home Living Family/patient expects to be discharged to:: Private residence Living Arrangements: Children Available Help at Discharge: Family;Available 24 hours/day Type of Home: House Home Access: Ramped entrance     Home Layout: One level Home Equipment: Walker - 2 wheels;Shower seat      Prior Function Level of Independence: Independent               Hand Dominance   Dominant Hand: Right    Extremity/Trunk Assessment   Upper Extremity Assessment: Generalized weakness           Lower Extremity Assessment: Generalized weakness      Cervical / Trunk Assessment: Kyphotic  Communication   Communication: Expressive difficulties;HOH  Cognition Arousal/Alertness: Awake/alert Behavior During Therapy: WFL for tasks assessed/performed Overall Cognitive Status: Within Functional Limits for tasks assessed                      General Comments      Exercises        Assessment/Plan    PT Assessment Patient needs continued PT services  PT Diagnosis Difficulty walking;Generalized weakness   PT Problem List Decreased strength;Decreased range of motion;Decreased activity tolerance;Decreased balance;Decreased mobility  PT Treatment Interventions DME instruction;Gait training;Functional mobility training;Therapeutic activities;Therapeutic exercise;Balance training   PT Goals (Current goals can be found in the Care Plan section) Acute Rehab PT Goals Patient Stated Goal: to get stronger PT  Goal Formulation: With patient Time For Goal Achievement: 04/05/15 Potential to Achieve Goals: Good    Frequency Min 3X/week   Barriers to discharge        Co-evaluation               End of Session Equipment Utilized During Treatment: Gait belt Activity Tolerance: Patient tolerated treatment  well Patient left: in chair;with call bell/phone within reach;with family/visitor present Nurse Communication: Mobility status         Time: [id number] PT Time Calculation (min) (ACUTE ONLY): 29 min   Charges:   PT Evaluation $Initial PT Evaluation Tier I: 1 Procedure PT Treatments $Gait Training: 8-22 mins   PT G CodesKingsley Callander 03/29/2015, 2:30 PM   Kittie Plater, PT, DPT Pager #: [phone] Office #: [phone]

## 2015-03-29 NOTE — Progress Notes (Signed)
[facility] PROGRESS NOTE  [name] [id number] DOB: January 09, 1924 DOA: 03/27/2015 PCP: Irven Shelling, MD  Assessment/Plan: 1-Fever, abdominal pain diarrhea; suspect C diff colitis.  Had recent antibiotics use.  Continue with Empirically flagyl.  GI pathogen pending  and C diff toxin negative, antigen positive.  Will treat for presume C diff due to diarrhea, fever and c diff antigen positive.   CT scan negative.  ESR and CRP elevated.   2-Metabolic acidosis; - increase IV fluids, IV bolus  -in setting of diarrhea.  -will change fluids to bicarb gtt.   3-Recent UTI; received 2 days of Bactrim. UA in the hospital Negative.   4-Frequent fall; will need PT evaluation. Ct head negative.  Needs HH PT.  5- Anemia; will check anemia panel. No evidence of active bleeding. Hold Lovenox. Start SCD/  Hb stable.   6-hyponatremia; IV fluids. Improving.   7-Possible AKI vs. Chronic kidney disease; in setting diarrhea, poor oral intake. Continue with IV fluids.   Rib pain-patient has tenderness to palpation along the lower rib cage on that. Musculoskeletal chest x-ray does not show any acute fracture. Improved.  -Incentive spirometry  Pancytopenia: Patient with hemoglobin and hematocrit of 10.3 and 21.8 respectively along with platelet count of 124 -Recheck in am. Might be related to infectious process.  Anemia panel consistent with iron deficiency. Needs iron at discharge.   Hx of arterial ischemic stroke -Continue Plavix   Anxiety state -Continue prn Ativan    Depression -Continue Zoloft 100 mg  Bipolar disorder (HCC) -Continue Seroquel  -Please allow patient to use home medication as she states it has to be the brand name    DVT prophylaxis: hold Lovenox due to anemia and thrombocytopenia.   Code Status: per family patient has DNR on file. I have change code status to DNR Family Communication: care discussed with son and daughter in law.   Disposition Plan: remain inpatient. Check for C diff, check CT scan.    Consultants:  none  Procedures:  none  Antibiotics:  Flagyl 11-13  HPI/Subjective: Diarrhea improved. Abdominal pain better   Objective: Filed Vitals:   03/29/15 1002  BP: 121/56  Pulse: 73  Temp: 98.6 F (37 C)  Resp: 18    Intake/Output Summary (Last 24 hours) at 03/29/15 1657 Last data filed at 03/29/15 1517  Gross per 24 hour  Intake    582 ml  Output    303 ml  Net    279 ml   Filed Weights   03/27/15 1629  Weight: 48.308 kg (106 lb 8 oz)    Exam:   General:  NAD  Cardiovascular: S 1, S 2 RRR  Respiratory: CTA  Abdomen: BS present, soft,   Musculoskeletal: no edema  Data Reviewed: Basic Metabolic Panel:  Recent Labs Lab 03/27/15 1603 03/28/15 0450 03/29/15 0632  NA 129* 131* 134*  K 3.9 3.4* 3.4*  CL 98* 106 112*  CO2 22 17* 14*  GLUCOSE 159* 117* 102*  BUN 22* 17 12  CREATININE 1.23* 1.07* 1.01*  CALCIUM 9.7 8.2* 8.9   Liver Function Tests:  Recent Labs Lab 03/27/15 1603  AST 35  ALT 20  ALKPHOS 61  BILITOT 0.5  PROT 7.4  ALBUMIN 3.9   No results for input(s): LIPASE, AMYLASE in the last 168 hours. No results for input(s): AMMONIA in the last 168 hours. CBC:  Recent Labs Lab 03/27/15 1603 03/28/15 0450 03/29/15 0632  WBC 7.9 4.9 3.7*  NEUTROABS 7.6  --   --  HGB 10.3* 7.9* 9.1*  HCT 29.8* 23.5* 27.6*  MCV 86.6 86.4 88.2  PLT 124* 103* 110*   Cardiac Enzymes: No results for input(s): CKTOTAL, CKMB, CKMBINDEX, TROPONINI in the last 168 hours. BNP (last 3 results) No results for input(s): BNP in the last 8760 hours.  ProBNP (last 3 results) No results for input(s): PROBNP in the last 8760 hours.  CBG: No results for input(s): GLUCAP in the last 168 hours.  Recent Results (from the past 240 hour(s))  Culture, blood (routine x 2)     Status: None (Preliminary result)   Collection Time: 03/27/15  4:43 PM  Result Value Ref Range  Status   Specimen Description BLOOD RIGHT ARM  Final   Special Requests BOTTLES DRAWN AEROBIC AND ANAEROBIC 5CC  Final   Culture NO GROWTH 2 DAYS  Final   Report Status PENDING  Incomplete  Urine culture     Status: None   Collection Time: 03/27/15  4:53 PM  Result Value Ref Range Status   Specimen Description URINE, CATHETERIZED  Final   Special Requests NONE  Final   Culture 1,000 COLONIES/mL INSIGNIFICANT GROWTH  Final   Report Status 03/29/2015 FINAL  Final  Culture, blood (routine x 2)     Status: None (Preliminary result)   Collection Time: 03/27/15  5:29 PM  Result Value Ref Range Status   Specimen Description BLOOD RIGHT ARM  Final   Special Requests BOTTLES DRAWN AEROBIC AND ANAEROBIC 5CC  Final   Culture NO GROWTH 2 DAYS  Final   Report Status PENDING  Incomplete  C difficile quick scan w PCR reflex     Status: Abnormal   Collection Time: 03/28/15  9:19 AM  Result Value Ref Range Status   C Diff antigen POSITIVE (A) NEGATIVE Final   C Diff toxin NEGATIVE NEGATIVE Final   C Diff interpretation   Final    C. difficile present, but toxin not detected. This indicates colonization. In most cases, this does not require treatment. If patient has signs and symptoms consistent with colitis, consider treatment. Requires ENTERIC precautions.     Studies: Ct Abdomen Pelvis Wo Contrast  03/28/2015  CLINICAL DATA:  81 year old female with acute abdominal pain and diarrhea for 1 day. EXAM: CT ABDOMEN AND PELVIS WITHOUT CONTRAST TECHNIQUE: Multidetector CT imaging of the abdomen and pelvis was performed following the standard protocol without IV contrast. COMPARISON:  None. FINDINGS: Please note that parenchymal abnormalities may be missed without intravenous contrast. Lower chest: Very small bilateral pleural effusions are identified, right greater than left. Coronary artery calcifications are present. Hepatobiliary: The liver and gallbladder are unremarkable. There is no evidence of  biliary dilatation. Pancreas: Unremarkable Spleen: Unremarkable Adrenals/Urinary Tract: Mild bilateral renal atrophy noted. There is no evidence of hydronephrosis or urinary calculi. The adrenal glands and bladder are unremarkable. Stomach/Bowel: There is no evidence of bowel obstruction. No definite focal ball wall thickening noted. The appendix is normal. Vascular/Lymphatic: No enlarged lymph nodes. Aortic atherosclerotic calcifications noted without aneurysm. Reproductive: The uterus and adnexal regions are within normal limits. Other: A moderate right inguinal hernia containing a loop of small bowel noted without bowel obstruction. There is no evidence of free fluid, pneumoperitoneum or focal collection. A small umbilical hernia containing fat is noted. Musculoskeletal: No acute or suspicious abnormality. Mild degenerative disc disease and moderate facet arthropathy throughout the lumbar spine identified. IMPRESSION: No evidence of acute abnormality within the abdomen or pelvis. Moderate right inguinal hernia containing a small bowel loop,  but without evidence of bowel obstruction. Very small bilateral pleural effusions, right greater than left. Coronary artery disease and aortic atherosclerosis. Electronically Signed   By: Margarette Canada M.D.   On: 03/28/2015 13:53   Dg Chest 2 View  03/27/2015  CLINICAL DATA:  Status post fall 03/24/2015 and 03/26/2015. Shortness of breath today. Initial encounter. EXAM: CHEST  2 VIEW COMPARISON:  PA and lateral chest 07/28/2009. FINDINGS: Mild subsegmental atelectasis is seen in the left lung base. The lungs are otherwise clear. Heart size is normal. No focal bony abnormality is identified. IMPRESSION: No acute disease. Electronically Signed   By: Inge Rise M.D.   On: 03/27/2015 18:28   Ct Head Wo Contrast  03/27/2015  CLINICAL DATA:  FALL Family reports pt having frequent falls, lack of appetite and fever. PMH: Stroke [facility]); Bipolar disorder ([place]); Depression;  Hypercholesteremia EXAM: CT HEAD WITHOUT CONTRAST TECHNIQUE: Contiguous axial images were obtained from the base of the skull through the vertex without intravenous contrast. COMPARISON:  04/22/2013 CT, 04/22/2013 MRI FINDINGS: There is an old lacunar infarct of the left basal ganglia. Significant periventricular white matter change identified consistent with small vessel disease. There is mild central and cortical atrophy. There is no intra or extra-axial fluid collection or mass lesion. The basilar cisterns and ventricles have a normal appearance. There is no CT evidence for acute infarction or hemorrhage. No acute abnormality identified on bone windows. There is significant atherosclerotic calcification of the internal carotid arteries. IMPRESSION: 1.  No evidence for acute intracranial abnormality. 2. Atrophy and small vessel disease. 3. Chronic left basal ganglia infarct. Electronically Signed   By: Nolon Nations M.D.   On: 03/27/2015 18:28   Dg Abd Portable 1v  03/28/2015  CLINICAL DATA:  81 year old female with fever and abdominal pain. EXAM: PORTABLE ABDOMEN - 1 VIEW COMPARISON:  None. FINDINGS: The bowel gas pattern is unremarkable. No dilated bowel loops are identified. Vascular calcifications are noted. No acute bony abnormalities are present. A lumbar scoliosis and degenerative changes in the lower lumbar spine noted. IMPRESSION: No evidence of acute abnormality. Electronically Signed   By: Margarette Canada M.D.   On: 03/28/2015 09:06    Scheduled Meds: . atorvastatin  10 mg Oral q1800  . clopidogrel  75 mg Oral Q breakfast  . metronidazole  500 mg Intravenous Q8H  . multivitamin-lutein  1 capsule Oral Daily  . potassium chloride  40 mEq Oral Q4H  . QUEtiapine  50 mg Oral QHS  . sertraline  100 mg Oral Daily  . sodium chloride  250 mL Intravenous Once  . sodium chloride  3 mL Intravenous Q12H   Continuous Infusions: .  sodium bicarbonate  infusion 1000 mL 75 mL/hr at 03/29/15 1343     Principal Problem:   Fever, unspecified Active Problems:   CVA (cerebral infarction)   Hypertension   Hyperlipidemia   Chronic kidney disease   AKI (acute kidney injury) ([place])   Hx of arterial ischemic stroke   Anxiety state   Depression   Bipolar disorder (HCC)   Hyponatremia   Hyponatremia with extracellular fluid depletion   Hx: UTI (urinary tract infection)   Anemia, iron deficiency   Diarrhea    Time spent: 25 minutes.     Niel Hummer A  [facility] Pager [phone]. If 7PM-7AM, please contact night-coverage at [email or web address], password [facility] 03/29/2015, 4:57 PM  LOS: 2 days

## 2015-03-30 LAB — GI PATHOGEN PANEL BY PCR, STOOL
Campylobacter by PCR: NOT DETECTED
Cryptosporidium by PCR: NOT DETECTED
E COLI (ETEC) LT/ST: NOT DETECTED
E COLI (STEC): NOT DETECTED
E COLI 0157 BY PCR: NOT DETECTED
G lamblia by PCR: NOT DETECTED
NOROVIRUS G1/G2: NOT DETECTED
Rotavirus A by PCR: NOT DETECTED
SALMONELLA BY PCR: NOT DETECTED
SHIGELLA BY PCR: NOT DETECTED

## 2015-03-30 LAB — BASIC METABOLIC PANEL
ANION GAP: 5 (ref 5–15)
BUN: 7 mg/dL (ref 6–20)
CHLORIDE: 109 mmol/L (ref 101–111)
CO2: 20 mmol/L — AB (ref 22–32)
Calcium: 8.3 mg/dL — ABNORMAL LOW (ref 8.9–10.3)
Creatinine, Ser: 0.76 mg/dL (ref 0.44–1.00)
GFR calc Af Amer: >60 mL/min (ref >60)
GFR calc non Af Amer: >60 mL/min (ref >60)
GLUCOSE: 99 mg/dL (ref 65–99)
POTASSIUM: 3.6 mmol/L (ref 3.5–5.1)
Sodium: 134 mmol/L — ABNORMAL LOW (ref 135–145)

## 2015-03-30 LAB — CBC
HEMATOCRIT: 23.2 % — AB (ref 36.0–46.0)
HEMOGLOBIN: 7.9 g/dL — AB (ref 12.0–15.0)
MCH: 29.4 pg (ref 26.0–34.0)
MCHC: 34.1 g/dL (ref 30.0–36.0)
MCV: 86.2 fL (ref 78.0–100.0)
Platelets: 110 10*3/uL — ABNORMAL LOW (ref 150–400)
RBC: 2.69 MIL/uL — AB (ref 3.87–5.11)
RDW: 14.4 % (ref 11.5–15.5)
WBC: 3.3 10*3/uL — ABNORMAL LOW (ref 4.0–10.5)

## 2015-03-30 MED ORDER — POTASSIUM CHLORIDE CRYS ER 20 MEQ PO TBCR
20.0000 meq | EXTENDED_RELEASE_TABLET | Freq: Once | ORAL | Status: AC
  Administered 2015-03-30: 20 meq via ORAL
  Filled 2015-03-30: qty 1

## 2015-03-30 MED ORDER — FERROUS SULFATE 325 (65 FE) MG PO TABS
325.0000 mg | ORAL_TABLET | Freq: Two times a day (BID) | ORAL | Status: DC
  Administered 2015-03-30 – 2015-03-31 (×3): 325 mg via ORAL
  Filled 2015-03-30 (×3): qty 1

## 2015-03-30 MED ORDER — METRONIDAZOLE 500 MG PO TABS
500.0000 mg | ORAL_TABLET | Freq: Three times a day (TID) | ORAL | Status: DC
  Administered 2015-03-30 – 2015-03-31 (×4): 500 mg via ORAL
  Filled 2015-03-30 (×4): qty 1

## 2015-03-30 NOTE — Care Management Important Message (Signed)
Important Message  Patient Details  Name: [name] MRN: [id number] Date of Birth: 12-Feb-1924   Medicare Important Message Given:  Yes    Maxtyn Nuzum, Rory Percy, RN 03/30/2015, 12:53 PM

## 2015-03-30 NOTE — Progress Notes (Signed)
[facility] PROGRESS NOTE  [name] AOZ:[id number] DOB: August 06, 1923 DOA: 03/27/2015 PCP: Irven Shelling, MD  Assessment/Plan: Patient was admitt ed 11-12 with fever, received recent treatment for UTI with Bactrim for 2 days. She develops Diarrhea whithin 12 hours of admission. C diff antigen positive, toxin negative. She has improved on Flagyl. Will continue treatment for presume C diff.    1-Fever, abdominal pain diarrhea; suspect C diff colitis.  Had recent antibiotics use.  Continue with Empirically flagyl.  GI pathogen pending  and C diff toxin negative, antigen positive.  Will treat for presume C diff due to diarrhea, fever and c diff antigen positive.   CT scan negative.  ESR and CRP elevated.  Improving.   2-Metabolic acidosis; -improved with bicarb Gtt. Will NSL fluids today, repeat labs in am.  -in setting of diarrhea.   3-Recent UTI; received 2 days of Bactrim. UA in the hospital Negative. Urine culture on admission insignificant growth.   4-Frequent fall; will need PT evaluation. Ct head negative.  Needs HH PT.  5- Anemia; will check anemia panel. No evidence of active bleeding. Hold Lovenox. Start SCD/  Hb drop to 7.9. Iron deficiency. Will start ferrous sulfate.   6-hyponatremia; IV fluids. Improving.   7-Possible AKI vs. Chronic kidney disease; in setting diarrhea, poor oral intake. Continue with IV fluids.   Rib pain-patient has tenderness to palpation along the lower rib cage on that. Musculoskeletal chest x-ray does not show any acute fracture. Improved.  -Incentive spirometry  Pancytopenia: Patient with hemoglobin and hematocrit of 10.3 and 21.8 respectively along with platelet count of 124 -Recheck in am. Might be related to infectious process.  Anemia panel consistent with iron deficiency. Needs iron at discharge.   Hx of arterial ischemic stroke -Continue Plavix   Anxiety state -Continue prn Ativan    Depression -Continue Zoloft  100 mg  Bipolar disorder (HCC) -Continue Seroquel  -Please allow patient to use home medication as she states it has to be the brand name    DVT prophylaxis: hold Lovenox due to anemia and thrombocytopenia.   Code Status: per family patient has DNR on file. I have change code status to DNR Family Communication: care discussed with son and daughter in law.  Disposition Plan: remain inpatient, monitor off IV fluids, flagyl by Mouth, discharge in 24 hours if electrolytes stable.    Consultants:  none  Procedures:  none  Antibiotics:  Flagyl 11-13  HPI/Subjective: She is feeling better,. Diarrhea improving, less frequents.   Objective: Filed Vitals:   03/30/15 0930  BP: 112/85  Pulse: 76  Temp: 97.9 F (36.6 C)  Resp: 18    Intake/Output Summary (Last 24 hours) at 03/30/15 1615 Last data filed at 03/30/15 1343  Gross per 24 hour  Intake 2001.25 ml  Output    300 ml  Net 1701.25 ml   Filed Weights   03/27/15 1629 03/29/15 2052  Weight: 48.308 kg (106 lb 8 oz) 49.4 kg (108 lb 14.5 oz)    Exam:   General:  NAD  Cardiovascular: S 1, S 2 RRR  Respiratory: CTA  Abdomen: BS present, soft,   Musculoskeletal: no edema  Data Reviewed: Basic Metabolic Panel:  Recent Labs Lab 03/27/15 1603 03/28/15 0450 03/29/15 0632 03/30/15 0547  NA 129* 131* 134* 134*  K 3.9 3.4* 3.4* 3.6  CL 98* 106 112* 109  CO2 22 17* 14* 20*  GLUCOSE 159* 117* 102* 99  BUN 22* $Remov'17 12 7  'nuhVcl$ CREATININE 1.23*  1.07* 1.01* 0.76  CALCIUM 9.7 8.2* 8.9 8.3*   Liver Function Tests:  Recent Labs Lab 03/27/15 1603  AST 35  ALT 20  ALKPHOS 61  BILITOT 0.5  PROT 7.4  ALBUMIN 3.9   No results for input(s): LIPASE, AMYLASE in the last 168 hours. No results for input(s): AMMONIA in the last 168 hours. CBC:  Recent Labs Lab 03/27/15 1603 03/28/15 0450 03/29/15 0632 03/30/15 0547  WBC 7.9 4.9 3.7* 3.3*  NEUTROABS 7.6  --   --   --   HGB 10.3* 7.9* 9.1* 7.9*  HCT 29.8*  23.5* 27.6* 23.2*  MCV 86.6 86.4 88.2 86.2  PLT 124* 103* 110* 110*   Cardiac Enzymes: No results for input(s): CKTOTAL, CKMB, CKMBINDEX, TROPONINI in the last 168 hours. BNP (last 3 results) No results for input(s): BNP in the last 8760 hours.  ProBNP (last 3 results) No results for input(s): PROBNP in the last 8760 hours.  CBG: No results for input(s): GLUCAP in the last 168 hours.  Recent Results (from the past 240 hour(s))  Culture, blood (routine x 2)     Status: None (Preliminary result)   Collection Time: 03/27/15  4:43 PM  Result Value Ref Range Status   Specimen Description BLOOD RIGHT ARM  Final   Special Requests BOTTLES DRAWN AEROBIC AND ANAEROBIC 5CC  Final   Culture NO GROWTH 3 DAYS  Final   Report Status PENDING  Incomplete  Urine culture     Status: None   Collection Time: 03/27/15  4:53 PM  Result Value Ref Range Status   Specimen Description URINE, CATHETERIZED  Final   Special Requests NONE  Final   Culture 1,000 COLONIES/mL INSIGNIFICANT GROWTH  Final   Report Status 03/29/2015 FINAL  Final  Culture, blood (routine x 2)     Status: None (Preliminary result)   Collection Time: 03/27/15  5:29 PM  Result Value Ref Range Status   Specimen Description BLOOD RIGHT ARM  Final   Special Requests BOTTLES DRAWN AEROBIC AND ANAEROBIC 5CC  Final   Culture NO GROWTH 3 DAYS  Final   Report Status PENDING  Incomplete  C difficile quick scan w PCR reflex     Status: Abnormal   Collection Time: 03/28/15  9:19 AM  Result Value Ref Range Status   C Diff antigen POSITIVE (A) NEGATIVE Final   C Diff toxin NEGATIVE NEGATIVE Final   C Diff interpretation   Final    C. difficile present, but toxin not detected. This indicates colonization. In most cases, this does not require treatment. If patient has signs and symptoms consistent with colitis, consider treatment. Requires ENTERIC precautions.     Studies: No results found.  Scheduled Meds: . atorvastatin  10 mg Oral  q1800  . clopidogrel  75 mg Oral Q breakfast  . ferrous sulfate  325 mg Oral BID WC  . metroNIDAZOLE  500 mg Oral Q8H  . multivitamin-lutein  1 capsule Oral Daily  . QUEtiapine  50 mg Oral QHS  . sertraline  100 mg Oral Daily  . sodium chloride  3 mL Intravenous Q12H   Continuous Infusions:    Principal Problem:   Fever, unspecified Active Problems:   CVA (cerebral infarction)   Hypertension   Hyperlipidemia   Chronic kidney disease   AKI (acute kidney injury) ([place])   Hx of arterial ischemic stroke   Anxiety state   Depression   Bipolar disorder (HCC)   Hyponatremia   Hyponatremia with extracellular  fluid depletion   Hx: UTI (urinary tract infection)   Anemia, iron deficiency   Diarrhea    Time spent: 25 minutes.     Niel Hummer A  [facility] Pager [phone]. If 7PM-7AM, please contact night-coverage at [email or web address], password [facility] 03/30/2015, 4:15 PM  LOS: 3 days

## 2015-03-30 NOTE — Progress Notes (Signed)
Physical Therapy Treatment Patient Details Name: [name] MRN: [id number] DOB: February 06, 1924 Today's Date: 03/30/2015    History of Present Illness Patient is a 81 year old female with a past medical history significant for hypertension, hyperlipidemia, CVA of the left basal ganglia, bipolar, depression; who presents with complaints of falls and fever of 102F.     PT Comments    Pt progressing well towards all goals. Was able to ambulate without RW this date and min guard assist. Pt safe to d/c home once medically stable with family to provide 24/7 assist.  Follow Up Recommendations  Home health PT;Supervision/Assistance - 24 hour     Equipment Recommendations  None recommended by PT    Recommendations for Other Services       Precautions / Restrictions Precautions Precautions: Fall Restrictions Weight Bearing Restrictions: No    Mobility  Bed Mobility Overal bed mobility: Needs Assistance Bed Mobility: Supine to Sit     Supine to sit: Min assist     General bed mobility comments: increased time, observied dtr in law assisting pt with trunk  Transfers Overall transfer level: Needs assistance Equipment used: 1 person hand held assist Transfers: Sit to/from Stand Sit to Stand: Min guard         General transfer comment: increased time, v/c's for hand placement  Ambulation/Gait Ambulation/Gait assistance: Min guard Ambulation Distance (Feet): 350 Feet Assistive device: None Gait Pattern/deviations: Step-through pattern;Decreased stride length;Shuffle Gait velocity: decreased but improved from yesterday   General Gait Details: pt with no arm swing and guarded but no overt episodes of LOB or instability. pt with increased fear of falling and prefered PT to hold onto gait belt.   Stairs            Wheelchair Mobility    Modified Rankin (Stroke Patients Only)       Balance           Standing balance support: No upper extremity  supported Standing balance-Leahy Scale: Fair                      Cognition Arousal/Alertness: Awake/alert Behavior During Therapy: WFL for tasks assessed/performed Overall Cognitive Status: Within Functional Limits for tasks assessed                      Exercises      General Comments General comments (skin integrity, edema, etc.): pt assisted to commode, minA for hygiene      Pertinent Vitals/Pain Pain Assessment: No/denies pain    Home Living                      Prior Function            PT Goals (current goals can now be found in the care plan section) Progress towards PT goals: Progressing toward goals    Frequency  Min 3X/week    PT Plan Current plan remains appropriate    Co-evaluation             End of Session Equipment Utilized During Treatment: Gait belt Activity Tolerance: Patient tolerated treatment well Patient left: in chair;with call bell/phone within reach;with family/visitor present     Time: 1136-1210 PT Time Calculation (min) (ACUTE ONLY): 34 min  Charges:  $Gait Training: 8-22 mins $Therapeutic Activity: 8-22 mins                    G Codes:  Kingsley Callander 03/30/2015, 12:29 PM   Kittie Plater, PT, DPT Pager #: [phone] Office #: [phone]

## 2015-03-30 NOTE — Progress Notes (Signed)
Utilization review completed. Darlyne Schmiesing, RN, BSN. 

## 2015-03-31 DIAGNOSIS — F411 Generalized anxiety disorder: Secondary | ICD-10-CM

## 2015-03-31 DIAGNOSIS — I10 Essential (primary) hypertension: Secondary | ICD-10-CM

## 2015-03-31 DIAGNOSIS — A047 Enterocolitis due to Clostridium difficile: Secondary | ICD-10-CM

## 2015-03-31 DIAGNOSIS — A0472 Enterocolitis due to Clostridium difficile, not specified as recurrent: Secondary | ICD-10-CM | POA: Insufficient documentation

## 2015-03-31 DIAGNOSIS — K219 Gastro-esophageal reflux disease without esophagitis: Secondary | ICD-10-CM | POA: Insufficient documentation

## 2015-03-31 DIAGNOSIS — E872 Acidosis, unspecified: Secondary | ICD-10-CM | POA: Insufficient documentation

## 2015-03-31 DIAGNOSIS — R509 Fever, unspecified: Secondary | ICD-10-CM

## 2015-03-31 DIAGNOSIS — E785 Hyperlipidemia, unspecified: Secondary | ICD-10-CM

## 2015-03-31 DIAGNOSIS — N179 Acute kidney failure, unspecified: Principal | ICD-10-CM

## 2015-03-31 DIAGNOSIS — D509 Iron deficiency anemia, unspecified: Secondary | ICD-10-CM

## 2015-03-31 DIAGNOSIS — A09 Infectious gastroenteritis and colitis, unspecified: Secondary | ICD-10-CM

## 2015-03-31 DIAGNOSIS — F329 Major depressive disorder, single episode, unspecified: Secondary | ICD-10-CM

## 2015-03-31 LAB — CBC
HCT: 23.3 % — ABNORMAL LOW (ref 36.0–46.0)
Hemoglobin: 7.7 g/dL — ABNORMAL LOW (ref 12.0–15.0)
MCH: 28.6 pg (ref 26.0–34.0)
MCHC: 33 g/dL (ref 30.0–36.0)
MCV: 86.6 fL (ref 78.0–100.0)
PLATELETS: 132 10*3/uL — AB (ref 150–400)
RBC: 2.69 MIL/uL — ABNORMAL LOW (ref 3.87–5.11)
RDW: 14.3 % (ref 11.5–15.5)
WBC: 3.4 10*3/uL — AB (ref 4.0–10.5)

## 2015-03-31 LAB — BASIC METABOLIC PANEL
ANION GAP: 5 (ref 5–15)
BUN: 8 mg/dL (ref 6–20)
CALCIUM: 8.5 mg/dL — AB (ref 8.9–10.3)
CO2: 25 mmol/L (ref 22–32)
CREATININE: 0.82 mg/dL (ref 0.44–1.00)
Chloride: 104 mmol/L (ref 101–111)
Glucose, Bld: 95 mg/dL (ref 65–99)
Potassium: 4 mmol/L (ref 3.5–5.1)
SODIUM: 134 mmol/L — AB (ref 135–145)

## 2015-03-31 MED ORDER — POLYSACCHARIDE IRON COMPLEX 150 MG PO CAPS: 150.0000 mg | ORAL_CAPSULE | Freq: Two times a day (BID) | ORAL | Status: DC

## 2015-03-31 MED ORDER — DOCUSATE SODIUM 50 MG PO CAPS
50.0000 mg | ORAL_CAPSULE | Freq: Every day | ORAL | Status: DC | PRN
Start: 2015-03-31 — End: 2017-10-03

## 2015-03-31 MED ORDER — METRONIDAZOLE 500 MG PO TABS: 500.0000 mg | ORAL_TABLET | Freq: Three times a day (TID) | ORAL | Status: AC

## 2015-03-31 MED ORDER — FAMOTIDINE 20 MG PO TABS: 20.0000 mg | ORAL_TABLET | Freq: Two times a day (BID) | ORAL | Status: DC

## 2015-03-31 NOTE — Care Management Note (Signed)
Case Management Note  Patient Details  Name: WINDY DUDEK MRN: [id number] Date of Birth: 1924/04/24  Subjective/Objective:           CM following for progression and d/c planning.         Action/Plan: 03/31/2015 Met with pt and daughter in law, Sharleen Szczesny, pt has DME and family is very supportive therefore they are declining HHPT services.   Expected Discharge Date:       03/31/2015           Expected Discharge Plan:  Home/Self Care  In-House Referral:  Clinical Social Work  Discharge planning Services  CM Consult  Post Acute Care Choice:  NA Choice offered to:  NA  DME Arranged:   NA DME Agency:   NA  HH Arranged:   NA HH Agency:   NA  Status of Service:  Completed, signed off  Medicare Important Message Given:  Yes Date Medicare IM Given:    Medicare IM give by:    Date Additional Medicare IM Given:    Additional Medicare Important Message give by:     If discussed at [place] of Stay Meetings, dates discussed:    Additional Comments:  Adron Bene, RN 03/31/2015, 10:43 AM

## 2015-03-31 NOTE — Discharge Summary (Signed)
Physician Discharge Summary  [name] [id number] DOB: 1923-07-10 DOA: 03/27/2015  PCP: Irven Shelling, MD  Admit date: 03/27/2015 Discharge date: 03/31/2015  Time spent: 35 minutes  Recommendations for Outpatient Follow-up:  1. Repeat BMET to follow electrolytes and renal function 2. Please repeat CBC to follow Hgb trend  Discharge Diagnoses:  Principal Problem:   Fever, unspecified Active Problems:   CVA (cerebral infarction)   Hypertension   Hyperlipidemia   Chronic kidney disease   AKI (acute kidney injury) ([place])   Hx of arterial ischemic stroke   Anxiety state   Depression   Bipolar disorder (HCC)   Hyponatremia   Hyponatremia with extracellular fluid depletion   Hx: UTI (urinary tract infection)   Anemia, iron deficiency   Diarrhea   Discharge Condition: stable and improved. Discharge home with family care. Patient will follow up with PCP in 10 days.  Diet recommendation: heart healthy diet  Filed Weights   03/27/15 1629 03/29/15 2052 03/30/15 2052  Weight: 48.308 kg (106 lb 8 oz) 49.4 kg (108 lb 14.5 oz) 49.7 kg (109 lb 9.1 oz)    History of present illness:  81 year old female with a past medical history significant for hypertension, hyperlipidemia, CVA of the left basal ganglia, bipolar, depression; who presents with complaints of falls and fever of 102F. History is obtained from the patient and patient's daughter-in-law. 4 days ago the patient had been incontinent of urine which she usually never does. The following morning she had a fall and when she went to use the bathroom her urine was noted to be dark cloudy with a foul odor. Family immediately had her scheduled with appointment with her PCP in the same day. At his office she was diagnosed with a UTI and started on ciprofloxacin. Patient took 2 doses of ciprofloxacin. However, there were called by the patient's doctor's office on Friday as cultures showed that the bacteria found was resistant to  ciprofloxacin and she was started on Bactrim which was thought to be sensitive. Patient took 2 doses of Bactrim and this morning had a another fall against the door sliding down. Family notes with the falls and they did not think that the patient lost consciousness or had any significant trauma to her head. After falls patient complained of pain around the lower rib cage. Patient able to move all extremities although has bruises on her right shoulder, elbow, and hip. Family notes today that she started developing a fever that went as high as 102.58F at home(these may have taken rectally as patient has no teeth). associated symptoms of a loose stool this morning and poor appetite over the last 1-2 days.  Hospital Course:  1-Fever, abdominal pain diarrhea; suspect C diff enteritis.  -Had recent antibiotics use as an outpatinet.  -positive diarrhea and GI upset symptoms -positive C. Diff antigen, but neg toxin  -excellent response to treatment with flagyl -will discharge on falgyl for 10 days -advise to maintain good hydration and to stop colace -famotidine started instead of omeprazole while treating for C> diff.  2-Metabolic acidosis; due to diarrhea -improved with bicarb Gtt.  -bicarb WNL at discharge -advise to maintain adequate hydration and nutrition   3-Recent UTI; UA in the hospital Negative. Urine culture on admission with insignificant growth and no dysuria -advise to maintain adequate hydration    4-Frequent fall; per PT rec's patient will benefit of HHPT; after discussing with family they felt she is not far from baseline and has decline HHPT at this time.  5- GERD: will use famotidine while treating for C. diff  6-hyponatremia; Improved with IVF's -advise to maintain adequate hydration   7-Possible AKI vs. Chronic kidney disease; in setting diarrhea, poor oral intake and continue use of nephrotoxic agents. -resolved with IVF's -at discharge ARB resumed -BMET to be follow  by PCP to reassess renal functions.   8-Rib pain-patient has tenderness to palpation along the lower rib cage. Musculoskeletal chest x-ray does not show any acute fracture.  -has since then Improved.  -advise to continue Incentive spirometry  9-Pancytopenia: Patient with hemoglobin and hematocrit of 10.3 and 21.8 respectively along with platelet count of 124. Recent use of bactrim and component of hemodilution with IVF's resuscitation  -no signs of overt bleeding -asymptomatic -Anemia panel suggesting iron deficiency anemia -will discharge on niferex BID -Follow Hgb trend  10-Hx of arterial ischemic stroke -Continue Plavix -no new deficit    11-Anxiety state -Continue prn Ativan    12-Depression -Continue Zoloft 100 mg   Procedures:  See below for x-ray reports   Consultations:  None   Discharge Exam: Filed Vitals:   03/31/15 0600  BP: 125/57  Pulse: 78  Temp: 98.1 F (36.7 C)  Resp: 18    General: NAD, no fever, no nausea/vomiting and no further diarrhea   Cardiovascular: S 1, S 2 RRR  Respiratory: CTA  Abdomen: BS present, soft,   Musculoskeletal: no edema  Discharge Instructions   Discharge Instructions    Discharge instructions    Complete by:  As directed   Maintain adequate hydration Take medications as prescribed Arrange follow up with PCP in 10 days Follow a heart healthy diet          Current Discharge Medication List    START taking these medications   Details  famotidine (PEPCID) 20 MG tablet Take 1 tablet (20 mg total) by mouth 2 (two) times daily. Qty: 60 tablet, Refills: 1    iron polysaccharides (NIFEREX) 150 MG capsule Take 1 capsule (150 mg total) by mouth 2 (two) times daily. Qty: 60 capsule, Refills: 2    metroNIDAZOLE (FLAGYL) 500 MG tablet Take 1 tablet (500 mg total) by mouth every 8 (eight) hours. Qty: 30 tablet, Refills: 0      CONTINUE these medications which have CHANGED   Details  docusate sodium  (COLACE) 50 MG capsule Take 1 capsule (50 mg total) by mouth daily as needed for moderate constipation (hold for diarrhea). Refills: 0      CONTINUE these medications which have NOT CHANGED   Details  amLODipine-olmesartan (AZOR) 10-40 MG tablet Take 1 tablet by mouth daily.    atorvastatin (LIPITOR) 10 MG tablet Take 10 mg by mouth daily.    clopidogrel (PLAVIX) 75 MG tablet Take 75 mg by mouth daily with breakfast.    LORazepam (ATIVAN) 0.5 MG tablet Take 0.25 mg by mouth daily as needed for anxiety (pt take a 1/4 of a 0.5 tablet).    Multiple Vitamins-Minerals (MULTI COMPLETE PO) Take 1 tablet by mouth daily.    QUEtiapine (SEROQUEL) 50 MG tablet Take 50 mg by mouth at bedtime. Pt can only use brand name drug    sertraline (ZOLOFT) 100 MG tablet Take 100 mg by mouth daily.      STOP taking these medications     Omeprazole (PRILOSEC PO)        No Known Allergies Follow-up Information    Follow up with Irven Shelling, MD In 10 days.   Specialty:  Internal Medicine  Contact information:   [address] [place] [address] [phone]       The results of significant diagnostics from this hospitalization (including imaging, microbiology, ancillary and laboratory) are listed below for reference.    Significant Diagnostic Studies: Ct Abdomen Pelvis Wo Contrast  03/28/2015  CLINICAL DATA:  81 year old female with acute abdominal pain and diarrhea for 1 day. EXAM: CT ABDOMEN AND PELVIS WITHOUT CONTRAST TECHNIQUE: Multidetector CT imaging of the abdomen and pelvis was performed following the standard protocol without IV contrast. COMPARISON:  None. FINDINGS: Please note that parenchymal abnormalities may be missed without intravenous contrast. Lower chest: Very small bilateral pleural effusions are identified, right greater than left. Coronary artery calcifications are present. Hepatobiliary: The liver and gallbladder are unremarkable. There is no  evidence of biliary dilatation. Pancreas: Unremarkable Spleen: Unremarkable Adrenals/Urinary Tract: Mild bilateral renal atrophy noted. There is no evidence of hydronephrosis or urinary calculi. The adrenal glands and bladder are unremarkable. Stomach/Bowel: There is no evidence of bowel obstruction. No definite focal ball wall thickening noted. The appendix is normal. Vascular/Lymphatic: No enlarged lymph nodes. Aortic atherosclerotic calcifications noted without aneurysm. Reproductive: The uterus and adnexal regions are within normal limits. Other: A moderate right inguinal hernia containing a loop of small bowel noted without bowel obstruction. There is no evidence of free fluid, pneumoperitoneum or focal collection. A small umbilical hernia containing fat is noted. Musculoskeletal: No acute or suspicious abnormality. Mild degenerative disc disease and moderate facet arthropathy throughout the lumbar spine identified. IMPRESSION: No evidence of acute abnormality within the abdomen or pelvis. Moderate right inguinal hernia containing a small bowel loop, but without evidence of bowel obstruction. Very small bilateral pleural effusions, right greater than left. Coronary artery disease and aortic atherosclerosis. Electronically Signed   By: Margarette Canada M.D.   On: 03/28/2015 13:53   Dg Chest 2 View  03/27/2015  CLINICAL DATA:  Status post fall 03/24/2015 and 03/26/2015. Shortness of breath today. Initial encounter. EXAM: CHEST  2 VIEW COMPARISON:  PA and lateral chest 07/28/2009. FINDINGS: Mild subsegmental atelectasis is seen in the left lung base. The lungs are otherwise clear. Heart size is normal. No focal bony abnormality is identified. IMPRESSION: No acute disease. Electronically Signed   By: Inge Rise M.D.   On: 03/27/2015 18:28   Ct Head Wo Contrast  03/27/2015  CLINICAL DATA:  FALL Family reports pt having frequent falls, lack of appetite and fever. PMH: Stroke [facility]); Bipolar disorder ([place]);  Depression; Hypercholesteremia EXAM: CT HEAD WITHOUT CONTRAST TECHNIQUE: Contiguous axial images were obtained from the base of the skull through the vertex without intravenous contrast. COMPARISON:  04/22/2013 CT, 04/22/2013 MRI FINDINGS: There is an old lacunar infarct of the left basal ganglia. Significant periventricular white matter change identified consistent with small vessel disease. There is mild central and cortical atrophy. There is no intra or extra-axial fluid collection or mass lesion. The basilar cisterns and ventricles have a normal appearance. There is no CT evidence for acute infarction or hemorrhage. No acute abnormality identified on bone windows. There is significant atherosclerotic calcification of the internal carotid arteries. IMPRESSION: 1.  No evidence for acute intracranial abnormality. 2. Atrophy and small vessel disease. 3. Chronic left basal ganglia infarct. Electronically Signed   By: Nolon Nations M.D.   On: 03/27/2015 18:28   Dg Abd Portable 1v  03/28/2015  CLINICAL DATA:  81 year old female with fever and abdominal pain. EXAM: PORTABLE ABDOMEN - 1 VIEW COMPARISON:  None. FINDINGS: The  bowel gas pattern is unremarkable. No dilated bowel loops are identified. Vascular calcifications are noted. No acute bony abnormalities are present. A lumbar scoliosis and degenerative changes in the lower lumbar spine noted. IMPRESSION: No evidence of acute abnormality. Electronically Signed   By: Margarette Canada M.D.   On: 03/28/2015 09:06    Microbiology: Recent Results (from the past 240 hour(s))  Culture, blood (routine x 2)     Status: None (Preliminary result)   Collection Time: 03/27/15  4:43 PM  Result Value Ref Range Status   Specimen Description BLOOD RIGHT ARM  Final   Special Requests BOTTLES DRAWN AEROBIC AND ANAEROBIC 5CC  Final   Culture NO GROWTH 4 DAYS  Final   Report Status PENDING  Incomplete  Urine culture     Status: None   Collection Time: 03/27/15  4:53 PM   Result Value Ref Range Status   Specimen Description URINE, CATHETERIZED  Final   Special Requests NONE  Final   Culture 1,000 COLONIES/mL INSIGNIFICANT GROWTH  Final   Report Status 03/29/2015 FINAL  Final  Culture, blood (routine x 2)     Status: None (Preliminary result)   Collection Time: 03/27/15  5:29 PM  Result Value Ref Range Status   Specimen Description BLOOD RIGHT ARM  Final   Special Requests BOTTLES DRAWN AEROBIC AND ANAEROBIC 5CC  Final   Culture NO GROWTH 4 DAYS  Final   Report Status PENDING  Incomplete  C difficile quick scan w PCR reflex     Status: Abnormal   Collection Time: 03/28/15  9:19 AM  Result Value Ref Range Status   C Diff antigen POSITIVE (A) NEGATIVE Final   C Diff toxin NEGATIVE NEGATIVE Final   C Diff interpretation   Final    C. difficile present, but toxin not detected. This indicates colonization. In most cases, this does not require treatment. If patient has signs and symptoms consistent with colitis, consider treatment. Requires ENTERIC precautions.     Labs: Basic Metabolic Panel:  Recent Labs Lab 03/27/15 1603 03/28/15 0450 03/29/15 0632 03/30/15 0547 03/31/15 0435  NA 129* 131* 134* 134* 134*  K 3.9 3.4* 3.4* 3.6 4.0  CL 98* 106 112* 109 104  CO2 22 17* 14* 20* 25  GLUCOSE 159* 117* 102* 99 95  BUN 22* 17 12 7 8   CREATININE 1.23* 1.07* 1.01* 0.76 0.82  CALCIUM 9.7 8.2* 8.9 8.3* 8.5*   Liver Function Tests:  Recent Labs Lab 03/27/15 1603  AST 35  ALT 20  ALKPHOS 61  BILITOT 0.5  PROT 7.4  ALBUMIN 3.9   CBC:  Recent Labs Lab 03/27/15 1603 03/28/15 0450 03/29/15 0632 03/30/15 0547 03/31/15 0435  WBC 7.9 4.9 3.7* 3.3* 3.4*  NEUTROABS 7.6  --   --   --   --   HGB 10.3* 7.9* 9.1* 7.9* 7.7*  HCT 29.8* 23.5* 27.6* 23.2* 23.3*  MCV 86.6 86.4 88.2 86.2 86.6  PLT 124* 103* 110* 110* 132*   Signed:  Barton Dubois  [facility] 03/31/2015, 3:39 PM

## 2015-03-31 NOTE — Progress Notes (Signed)
[name] to be D/C'd Home per MD order.  Discussed prescriptions and follow up appointments with the patient. Prescriptions given to patient, medication list explained in detail. Pt verbalized understanding.    Medication List    STOP taking these medications        PRILOSEC PO      TAKE these medications        atorvastatin 10 MG tablet  Commonly known as:  LIPITOR  Take 10 mg by mouth daily.     AZOR 10-40 MG tablet  Generic drug:  amLODipine-olmesartan  Take 1 tablet by mouth daily.     clopidogrel 75 MG tablet  Commonly known as:  PLAVIX  Take 75 mg by mouth daily with breakfast.     docusate sodium 50 MG capsule  Commonly known as:  COLACE  Take 1 capsule (50 mg total) by mouth daily as needed for moderate constipation (hold for diarrhea).     famotidine 20 MG tablet  Commonly known as:  PEPCID  Take 1 tablet (20 mg total) by mouth 2 (two) times daily.     iron polysaccharides 150 MG capsule  Commonly known as:  NIFEREX  Take 1 capsule (150 mg total) by mouth 2 (two) times daily.     LORazepam 0.5 MG tablet  Commonly known as:  ATIVAN  Take 0.25 mg by mouth daily as needed for anxiety (pt take a 1/4 of a 0.5 tablet).     metroNIDAZOLE 500 MG tablet  Commonly known as:  FLAGYL  Take 1 tablet (500 mg total) by mouth every 8 (eight) hours.     MULTI COMPLETE PO  Take 1 tablet by mouth daily.     QUEtiapine 50 MG tablet  Commonly known as:  SEROQUEL  Take 50 mg by mouth at bedtime. Pt can only use brand name drug     sertraline 100 MG tablet  Commonly known as:  ZOLOFT  Take 100 mg by mouth daily.        Filed Vitals:   03/31/15 0600  BP: 125/57  Pulse: 78  Temp: 98.1 F (36.7 C)  Resp: 18    Skin clean, dry and intact without evidence of skin break down, no evidence of skin tears noted. IV catheter discontinued intact. Site without signs and symptoms of complications. Dressing and pressure applied. Pt denies pain at this time. No complaints  noted.  An After Visit Summary was printed and given to the patient. Patient escorted via [place], and D/C home via private auto.  Carole Civil RN [facility] Phone [phone]

## 2015-04-01 LAB — CULTURE, BLOOD (ROUTINE X 2)
CULTURE: NO GROWTH
Culture: NO GROWTH

## 2015-04-16 DIAGNOSIS — K5900 Constipation, unspecified: Secondary | ICD-10-CM | POA: Diagnosis not present

## 2015-04-16 DIAGNOSIS — N390 Urinary tract infection, site not specified: Secondary | ICD-10-CM | POA: Diagnosis not present

## 2015-04-16 DIAGNOSIS — N179 Acute kidney failure, unspecified: Secondary | ICD-10-CM | POA: Diagnosis not present

## 2015-04-16 DIAGNOSIS — A047 Enterocolitis due to Clostridium difficile: Secondary | ICD-10-CM | POA: Diagnosis not present

## 2015-04-16 DIAGNOSIS — D649 Anemia, unspecified: Secondary | ICD-10-CM | POA: Diagnosis not present

## 2015-04-19 DIAGNOSIS — E871 Hypo-osmolality and hyponatremia: Secondary | ICD-10-CM | POA: Diagnosis not present

## 2015-05-04 DIAGNOSIS — E871 Hypo-osmolality and hyponatremia: Secondary | ICD-10-CM | POA: Diagnosis not present

## 2015-06-07 DIAGNOSIS — R1312 Dysphagia, oropharyngeal phase: Secondary | ICD-10-CM | POA: Diagnosis not present

## 2015-06-07 DIAGNOSIS — Z0000 Encounter for general adult medical examination without abnormal findings: Secondary | ICD-10-CM | POA: Diagnosis not present

## 2015-06-07 DIAGNOSIS — F419 Anxiety disorder, unspecified: Secondary | ICD-10-CM | POA: Diagnosis not present

## 2015-06-07 DIAGNOSIS — Z1389 Encounter for screening for other disorder: Secondary | ICD-10-CM | POA: Diagnosis not present

## 2015-06-07 DIAGNOSIS — K219 Gastro-esophageal reflux disease without esophagitis: Secondary | ICD-10-CM | POA: Diagnosis not present

## 2015-06-07 DIAGNOSIS — I10 Essential (primary) hypertension: Secondary | ICD-10-CM | POA: Diagnosis not present

## 2015-08-02 DIAGNOSIS — M810 Age-related osteoporosis without current pathological fracture: Secondary | ICD-10-CM | POA: Diagnosis not present

## 2015-12-13 DIAGNOSIS — I10 Essential (primary) hypertension: Secondary | ICD-10-CM | POA: Diagnosis not present

## 2015-12-13 DIAGNOSIS — M810 Age-related osteoporosis without current pathological fracture: Secondary | ICD-10-CM | POA: Diagnosis not present

## 2015-12-13 DIAGNOSIS — K219 Gastro-esophageal reflux disease without esophagitis: Secondary | ICD-10-CM | POA: Diagnosis not present

## 2016-02-09 DIAGNOSIS — Z23 Encounter for immunization: Secondary | ICD-10-CM | POA: Diagnosis not present

## 2016-03-27 DIAGNOSIS — H6122 Impacted cerumen, left ear: Secondary | ICD-10-CM | POA: Diagnosis not present

## 2016-03-27 DIAGNOSIS — H903 Sensorineural hearing loss, bilateral: Secondary | ICD-10-CM | POA: Diagnosis not present

## 2016-04-03 DIAGNOSIS — J209 Acute bronchitis, unspecified: Secondary | ICD-10-CM | POA: Diagnosis not present

## 2016-04-03 DIAGNOSIS — Z8673 Personal history of transient ischemic attack (TIA), and cerebral infarction without residual deficits: Secondary | ICD-10-CM | POA: Diagnosis not present

## 2016-06-19 DIAGNOSIS — I10 Essential (primary) hypertension: Secondary | ICD-10-CM | POA: Diagnosis not present

## 2016-06-19 DIAGNOSIS — F419 Anxiety disorder, unspecified: Secondary | ICD-10-CM | POA: Diagnosis not present

## 2016-08-03 DIAGNOSIS — L858 Other specified epidermal thickening: Secondary | ICD-10-CM | POA: Diagnosis not present

## 2016-08-04 DIAGNOSIS — D485 Neoplasm of uncertain behavior of skin: Secondary | ICD-10-CM | POA: Diagnosis not present

## 2016-08-04 DIAGNOSIS — L570 Actinic keratosis: Secondary | ICD-10-CM | POA: Diagnosis not present

## 2016-08-04 DIAGNOSIS — C44329 Squamous cell carcinoma of skin of other parts of face: Secondary | ICD-10-CM | POA: Diagnosis not present

## 2016-08-04 DIAGNOSIS — L821 Other seborrheic keratosis: Secondary | ICD-10-CM | POA: Diagnosis not present

## 2016-11-01 IMAGING — CT CT ABD-PELV W/O CM
2 of 4 series · 16 of 46 positions shown, 18 images · non-contrast
Comparison: None.

CLINICAL DATA: 89-year-old female with acute abdominal pain and
diarrhea for 1 day.

EXAM:
CT ABDOMEN AND PELVIS WITHOUT CONTRAST
TECHNIQUE: Multidetector CT imaging of the abdomen and pelvis was performed
following the standard protocol without IV contrast.

[Series 2: a/p w/o 5mm · axial · non-contrast · 0.70mm/px · z∈[+886,+1306]mm · 13 of 92 slices shown, 15 images]
[im 4/92  soft-tissue]
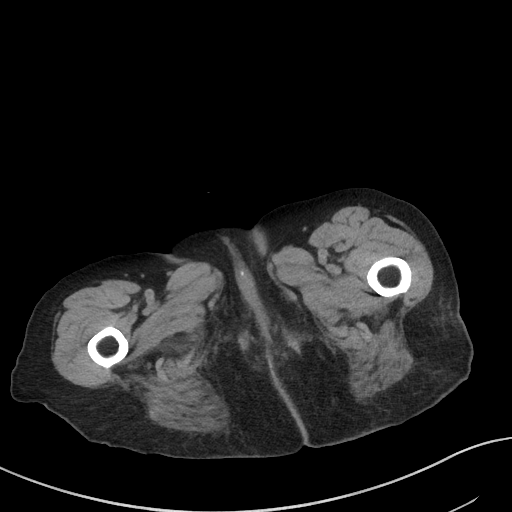
[im 4/92  bone]
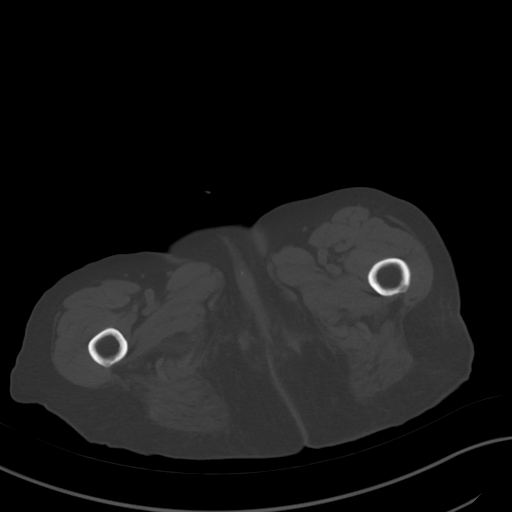
[im 11/92  soft-tissue]
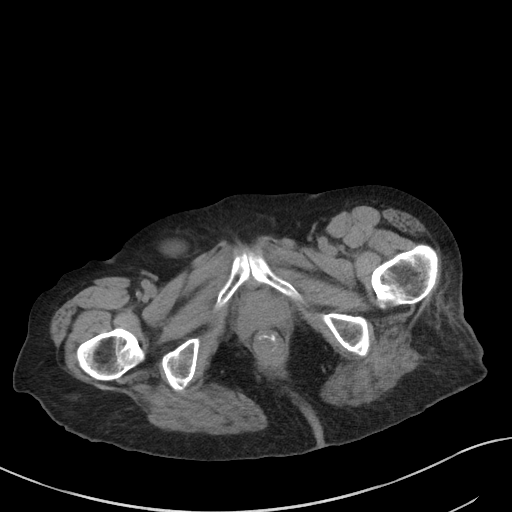
[im 19/92  soft-tissue]
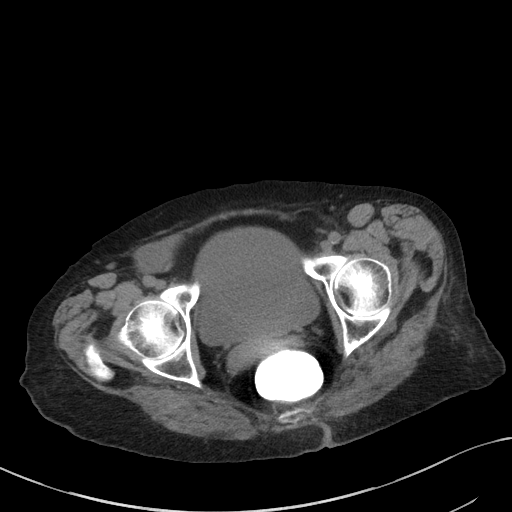
[im 26/92  soft-tissue]
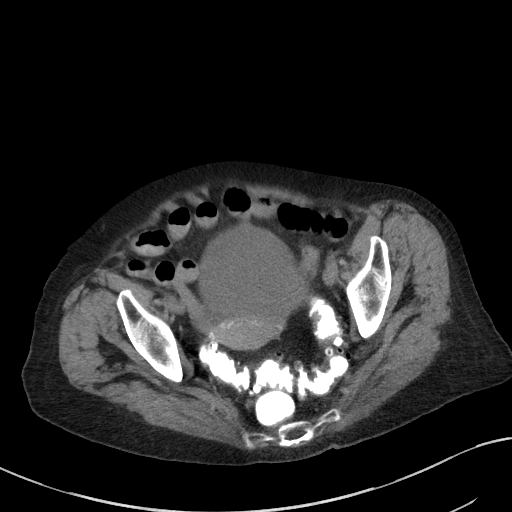
[im 33/92  soft-tissue]
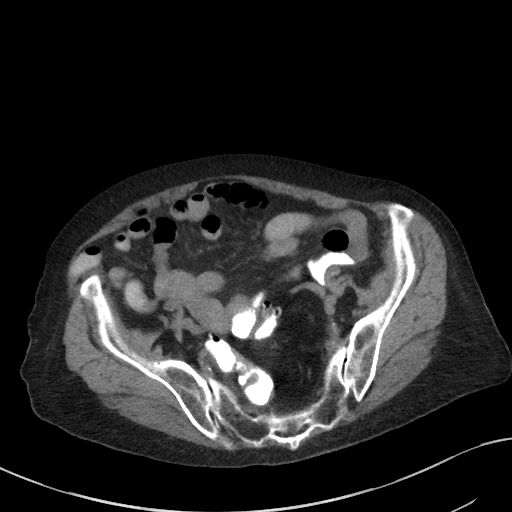
[im 41/92  soft-tissue]
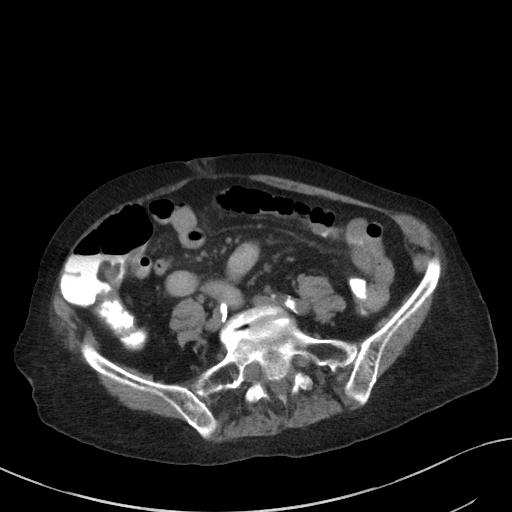
[im 48/92  soft-tissue]
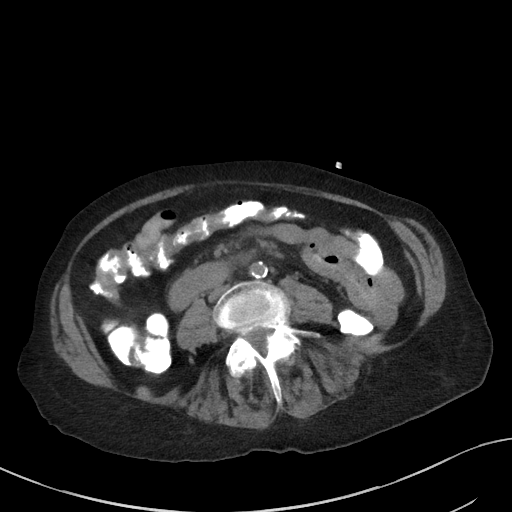
[im 51/92  soft-tissue]
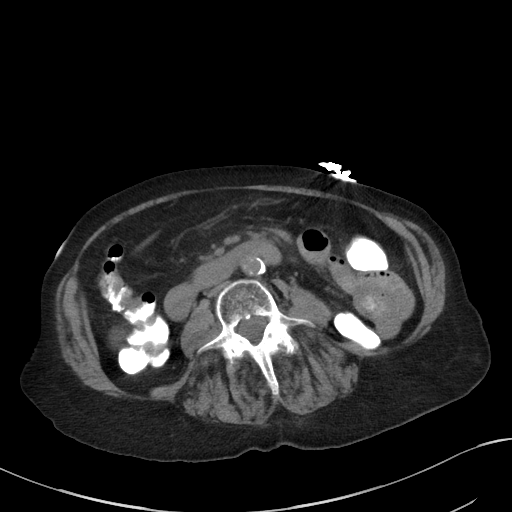
[im 59/92  soft-tissue]
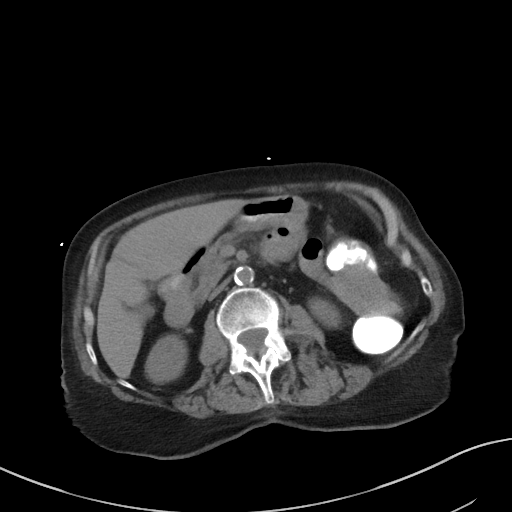
[im 59/92  bone]
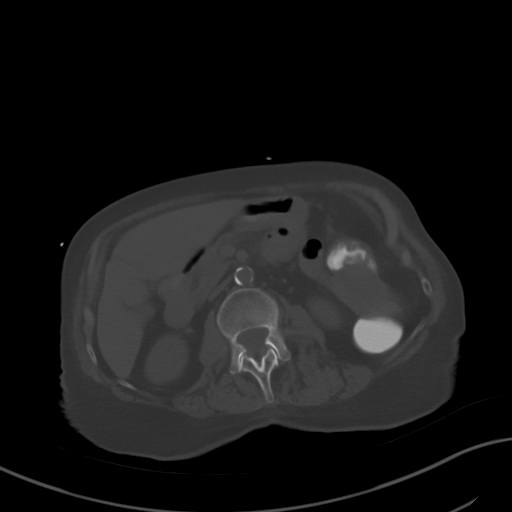
[im 66/92  soft-tissue]
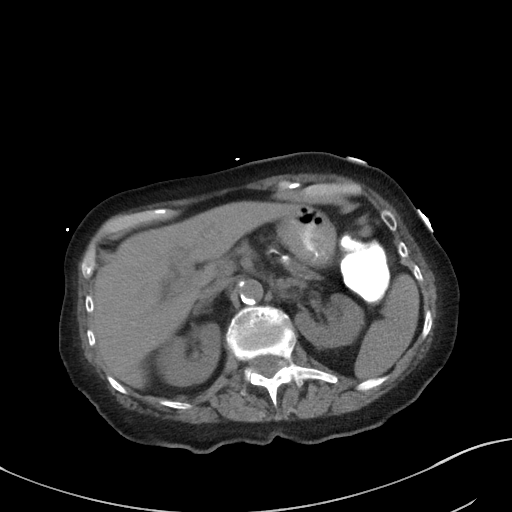
[im 73/92  soft-tissue]
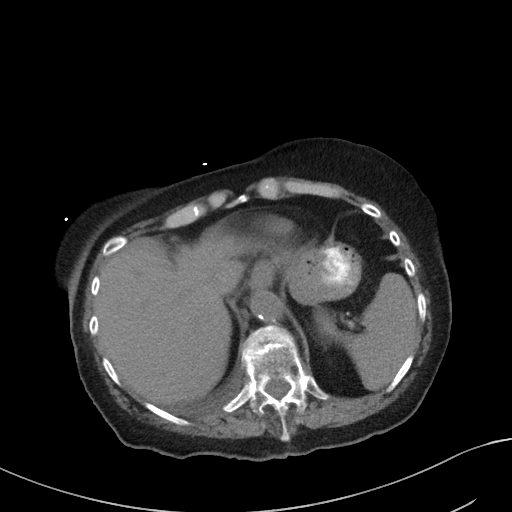
[im 81/92  soft-tissue]
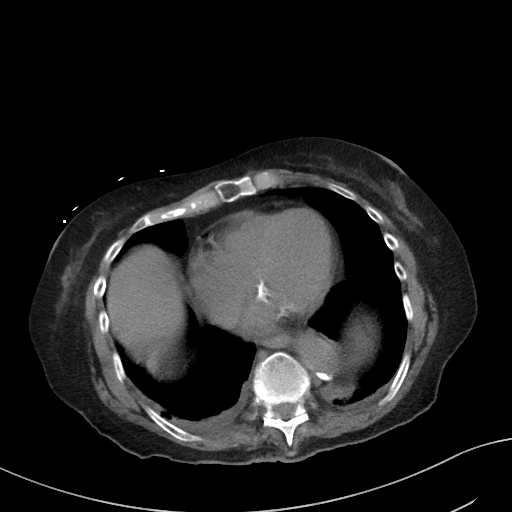
[im 88/92  soft-tissue]
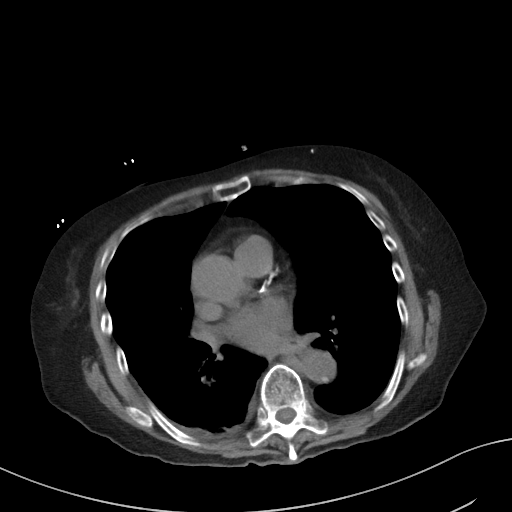

[Series 5: a/p w/o cor · coronal · non-contrast · 0.68mm/px · 3 of 102 slices shown]
[im 34/102  soft-tissue]
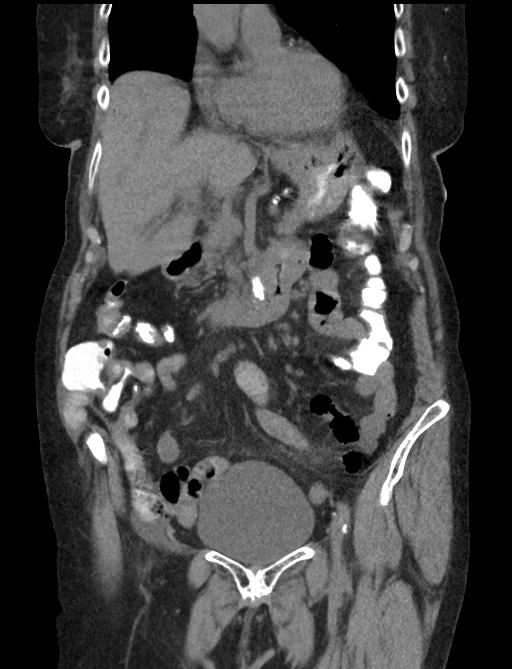
[im 45/102  soft-tissue]
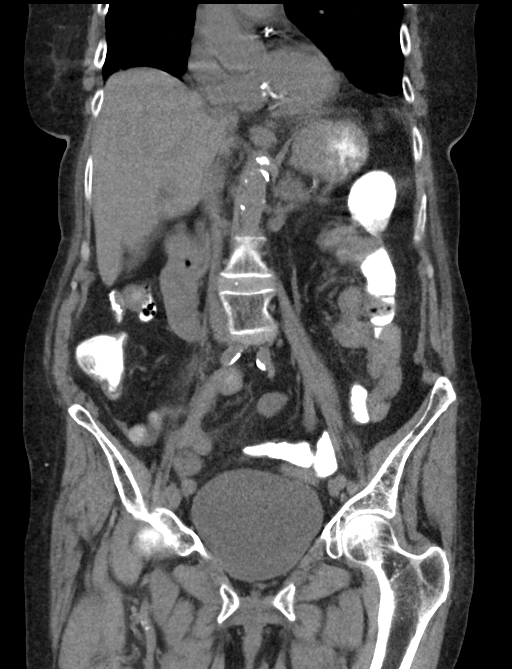
[im 57/102  soft-tissue]
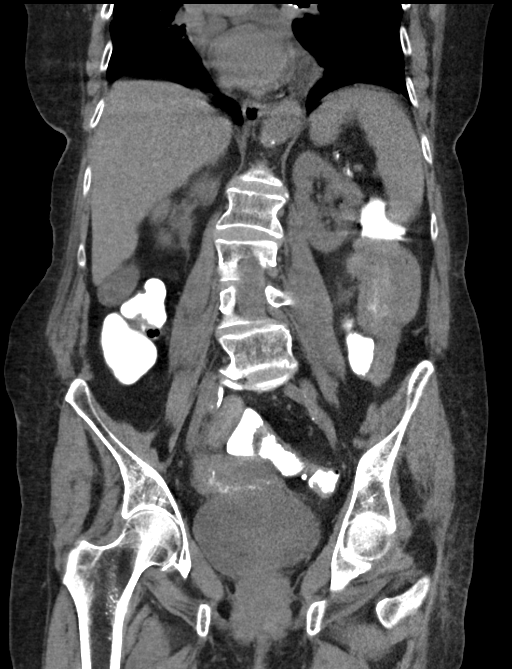

[16 of 46 positions shown; findings below may reference images not displayed]

FINDINGS: Please note that parenchymal abnormalities may be missed without
intravenous contrast.

Lower chest: Very small bilateral pleural effusions are identified,
right greater than left. Coronary artery calcifications are present.

Hepatobiliary: The liver and gallbladder are unremarkable. There is
no evidence of biliary dilatation.

Pancreas: Unremarkable

Spleen: Unremarkable

Adrenals/Urinary Tract: Mild bilateral renal atrophy noted. There is
no evidence of hydronephrosis or urinary calculi. The adrenal glands
and bladder are unremarkable.

Stomach/Bowel: There is no evidence of bowel obstruction. No
definite focal ball wall thickening noted. The appendix is normal.

Vascular/Lymphatic: No enlarged lymph nodes. Aortic atherosclerotic
calcifications noted without aneurysm.

Reproductive: The uterus and adnexal regions are within normal
limits.

Other: A moderate right inguinal hernia containing a loop of small
bowel noted without bowel obstruction. There is no evidence of free
fluid, pneumoperitoneum or focal collection. A small umbilical
hernia containing fat is noted.

Musculoskeletal: No acute or suspicious abnormality. Mild
degenerative disc disease and moderate facet arthropathy throughout
the lumbar spine identified.
IMPRESSION: No evidence of acute abnormality within the abdomen or pelvis.

Moderate right inguinal hernia containing a small bowel loop, but
without evidence of bowel obstruction.

Very small bilateral pleural effusions, right greater than left.

Coronary artery disease and aortic atherosclerosis.

## 2016-12-18 DIAGNOSIS — Z8673 Personal history of transient ischemic attack (TIA), and cerebral infarction without residual deficits: Secondary | ICD-10-CM | POA: Diagnosis not present

## 2016-12-18 DIAGNOSIS — Z0000 Encounter for general adult medical examination without abnormal findings: Secondary | ICD-10-CM | POA: Diagnosis not present

## 2016-12-18 DIAGNOSIS — I129 Hypertensive chronic kidney disease with stage 1 through stage 4 chronic kidney disease, or unspecified chronic kidney disease: Secondary | ICD-10-CM | POA: Diagnosis not present

## 2016-12-18 DIAGNOSIS — F419 Anxiety disorder, unspecified: Secondary | ICD-10-CM | POA: Diagnosis not present

## 2016-12-18 DIAGNOSIS — M810 Age-related osteoporosis without current pathological fracture: Secondary | ICD-10-CM | POA: Diagnosis not present

## 2016-12-18 DIAGNOSIS — R1312 Dysphagia, oropharyngeal phase: Secondary | ICD-10-CM | POA: Diagnosis not present

## 2016-12-18 DIAGNOSIS — Z1389 Encounter for screening for other disorder: Secondary | ICD-10-CM | POA: Diagnosis not present

## 2016-12-18 DIAGNOSIS — B372 Candidiasis of skin and nail: Secondary | ICD-10-CM | POA: Diagnosis not present

## 2016-12-18 DIAGNOSIS — N183 Chronic kidney disease, stage 3 (moderate): Secondary | ICD-10-CM | POA: Diagnosis not present

## 2016-12-18 DIAGNOSIS — E7800 Pure hypercholesterolemia, unspecified: Secondary | ICD-10-CM | POA: Diagnosis not present

## 2016-12-18 DIAGNOSIS — K219 Gastro-esophageal reflux disease without esophagitis: Secondary | ICD-10-CM | POA: Diagnosis not present

## 2017-03-04 DIAGNOSIS — Z23 Encounter for immunization: Secondary | ICD-10-CM | POA: Diagnosis not present

## 2017-03-11 ENCOUNTER — Emergency Department (HOSPITAL_COMMUNITY): Payer: Medicare Other

## 2017-03-11 ENCOUNTER — Encounter (HOSPITAL_COMMUNITY): Payer: Self-pay | Admitting: Emergency Medicine

## 2017-03-11 ENCOUNTER — Emergency Department (HOSPITAL_COMMUNITY)
Admission: EM | Admit: 2017-03-11 | Discharge: 2017-03-11 | Disposition: A | Discharge status: Home / Self Care | Payer: Medicare Other | Attending: Emergency Medicine | Admitting: Emergency Medicine

## 2017-03-11 DIAGNOSIS — R079 Chest pain, unspecified: Secondary | ICD-10-CM | POA: Diagnosis not present

## 2017-03-11 DIAGNOSIS — Z8673 Personal history of transient ischemic attack (TIA), and cerebral infarction without residual deficits: Secondary | ICD-10-CM | POA: Insufficient documentation

## 2017-03-11 DIAGNOSIS — I129 Hypertensive chronic kidney disease with stage 1 through stage 4 chronic kidney disease, or unspecified chronic kidney disease: Secondary | ICD-10-CM | POA: Insufficient documentation

## 2017-03-11 DIAGNOSIS — Y92009 Unspecified place in unspecified non-institutional (private) residence as the place of occurrence of the external cause: Secondary | ICD-10-CM | POA: Insufficient documentation

## 2017-03-11 DIAGNOSIS — Z7902 Long term (current) use of antithrombotics/antiplatelets: Secondary | ICD-10-CM | POA: Insufficient documentation

## 2017-03-11 DIAGNOSIS — Z79899 Other long term (current) drug therapy: Secondary | ICD-10-CM | POA: Diagnosis not present

## 2017-03-11 DIAGNOSIS — S199XXA Unspecified injury of neck, initial encounter: Secondary | ICD-10-CM | POA: Diagnosis not present

## 2017-03-11 DIAGNOSIS — Y999 Unspecified external cause status: Secondary | ICD-10-CM | POA: Diagnosis not present

## 2017-03-11 DIAGNOSIS — R0789 Other chest pain: Secondary | ICD-10-CM | POA: Insufficient documentation

## 2017-03-11 DIAGNOSIS — Y9301 Activity, walking, marching and hiking: Secondary | ICD-10-CM | POA: Insufficient documentation

## 2017-03-11 DIAGNOSIS — R0781 Pleurodynia: Secondary | ICD-10-CM | POA: Diagnosis not present

## 2017-03-11 DIAGNOSIS — W01190A Fall on same level from slipping, tripping and stumbling with subsequent striking against furniture, initial encounter: Secondary | ICD-10-CM | POA: Insufficient documentation

## 2017-03-11 DIAGNOSIS — S20212A Contusion of left front wall of thorax, initial encounter: Secondary | ICD-10-CM | POA: Insufficient documentation

## 2017-03-11 DIAGNOSIS — S0990XA Unspecified injury of head, initial encounter: Secondary | ICD-10-CM | POA: Diagnosis not present

## 2017-03-11 DIAGNOSIS — W19XXXA Unspecified fall, initial encounter: Secondary | ICD-10-CM | POA: Diagnosis not present

## 2017-03-11 DIAGNOSIS — N189 Chronic kidney disease, unspecified: Secondary | ICD-10-CM | POA: Diagnosis not present

## 2017-03-11 DIAGNOSIS — S299XXA Unspecified injury of thorax, initial encounter: Secondary | ICD-10-CM | POA: Diagnosis not present

## 2017-03-11 LAB — CBC
HEMATOCRIT: 26.7 % — AB (ref 36.0–46.0)
HEMOGLOBIN: 7.8 g/dL — AB (ref 12.0–15.0)
MCH: 22.9 pg — AB (ref 26.0–34.0)
MCHC: 29.2 g/dL — AB (ref 30.0–36.0)
MCV: 78.3 fL (ref 78.0–100.0)
Platelets: 196 10*3/uL (ref 150–400)
RBC: 3.41 MIL/uL — AB (ref 3.87–5.11)
RDW: 16.6 % — ABNORMAL HIGH (ref 11.5–15.5)
WBC: 7.2 10*3/uL (ref 4.0–10.5)

## 2017-03-11 LAB — BASIC METABOLIC PANEL
ANION GAP: 9 (ref 5–15)
BUN: 20 mg/dL (ref 6–20)
CALCIUM: 9.5 mg/dL (ref 8.9–10.3)
CO2: 22 mmol/L (ref 22–32)
Chloride: 106 mmol/L (ref 101–111)
Creatinine, Ser: 1.04 mg/dL — ABNORMAL HIGH (ref 0.44–1.00)
GFR, EST AFRICAN AMERICAN: 53 mL/min — AB (ref >60)
GFR, EST NON AFRICAN AMERICAN: 46 mL/min — AB (ref >60)
Glucose, Bld: 113 mg/dL — ABNORMAL HIGH (ref 65–99)
POTASSIUM: 4.2 mmol/L (ref 3.5–5.1)
Sodium: 137 mmol/L (ref 135–145)

## 2017-03-11 MED ORDER — ACETAMINOPHEN 500 MG PO TABS
1000.0000 mg | ORAL_TABLET | Freq: Once | ORAL | Status: AC
  Administered 2017-03-11: 1000 mg via ORAL
  Filled 2017-03-11: qty 2

## 2017-03-11 NOTE — ED Notes (Addendum)
Received call from nurse first that family reports pt has bruising to back of her head. Pt on Plavix. Call to Dr Billy Fischer who ordered head CT.

## 2017-03-11 NOTE — ED Provider Notes (Signed)
[place] EMERGENCY DEPARTMENT Provider Note CSN: [id number] Arrival date & time: 03/11/17  1437 History   Chief Complaint Chief Complaint  Patient presents with  . Fall  . left rib pain  . Head Injury   HPI [name] is a 83 y.o. female with PMH of HTN and anxiety presents after a mechanical fall from standing earlier this morning.  Patient states she was getting out of bed to walk to the bathroom when she lost her footing and fell backwards landing on her dresser.  She complains of pain to her left flank and left lateral chest area.  Complains of pain to her neck and head.  Patient denies any loss of consciousness.  Denies that prior to the incident she had any associated dizziness or chest pain.  Patient denies syncopal episode.  States that pain is made worse with deep respirations.  Patient has not tried anything to improve her symptoms.  Patient was seen in urgent care prior to arrival to the emergency department and advised to come to the emergency department for further evaluation.  She is currently on Plavix.  HPI  Past Medical History:  Diagnosis Date  . Bipolar disorder ([place])   . Depression   . Hypercholesteremia   . Hypertension   . Stroke [facility])     Patient Active Problem List   Diagnosis Date Noted  . Esophageal reflux   . Metabolic acidosis   . Enteritis due to Clostridium difficile   . Anemia, iron deficiency 03/28/2015  . Diarrhea 03/28/2015  . AKI (acute kidney injury) ([place]) 03/27/2015  . Hx of arterial ischemic stroke 03/27/2015  . Anxiety state 03/27/2015  . Depression 03/27/2015  . Bipolar disorder ([place]) 03/27/2015  . Fever, unspecified 03/27/2015  . Hyponatremia 03/27/2015  . Hyponatremia with extracellular fluid depletion 03/27/2015  . Hx: UTI (urinary tract infection) 03/27/2015  . Chronic kidney disease 04/23/2013  . CVA (cerebral infarction) 04/22/2013  . Hypertension 04/22/2013  . Hyperlipidemia 04/22/2013    History  reviewed. No pertinent surgical history.  OB History    No data available     Home Medications    Prior to Admission medications   Medication Sig Start Date End Date Taking? Authorizing Provider  amLODipine-olmesartan (AZOR) 10-40 MG tablet Take 1 tablet by mouth daily.   Yes [provider]  atorvastatin (LIPITOR) 10 MG tablet Take 10 mg by mouth daily.   Yes [provider]  clopidogrel (PLAVIX) 75 MG tablet Take 75 mg by mouth daily with breakfast.   Yes [provider]  docusate sodium (COLACE) 50 MG capsule Take 1 capsule (50 mg total) by mouth daily as needed for moderate constipation (hold for diarrhea). 03/31/15  Yes Barton Dubois, MD  famotidine (PEPCID) 20 MG tablet Take 1 tablet (20 mg total) by mouth 2 (two) times daily. 03/31/15  Yes Barton Dubois, MD  QUEtiapine (SEROQUEL) 50 MG tablet Take 50 mg by mouth at bedtime. Pt can only use brand name drug   Yes [provider]  sertraline (ZOLOFT) 100 MG tablet Take 100 mg by mouth daily.   Yes [provider]  iron polysaccharides (NIFEREX) 150 MG capsule Take 1 capsule (150 mg total) by mouth 2 (two) times daily. 03/31/15   Barton Dubois, MD  LORazepam (ATIVAN) 0.5 MG tablet Take 0.25 mg by mouth daily as needed for anxiety (pt take a 1/4 of a 0.5 tablet).    [provider]  Multiple Vitamins-Minerals (MULTI COMPLETE PO)  Take 1 tablet by mouth daily.    [provider]   Family History No family history on file.  Social History Social History  Substance Use Topics  . Smoking status: Never Smoker  . Smokeless tobacco: Never Used  . Alcohol use No    Allergies   Patient has no known allergies.   Review of Systems Review of Systems  Constitutional: Negative for chills and fever.  HENT: Negative for ear pain and sore throat.   Eyes: Negative for pain and visual disturbance.  Respiratory: Negative for cough and shortness of breath.   Cardiovascular:  Negative for chest pain and palpitations.  Gastrointestinal: Negative for abdominal pain and vomiting.  Genitourinary: Negative for dysuria and hematuria.  Musculoskeletal: Positive for back pain and neck pain. Negative for arthralgias.  Skin: Negative for color change and rash.  Neurological: Negative for seizures and syncope.  All other systems reviewed and are negative.  Physical Exam Updated Vital Signs BP (!) 149/65   Pulse 76   Temp (!) 97.5 F (36.4 C) (Oral)   Resp 14   Ht 4\' 9"  (1.448 m)   Wt 45.4 kg (100 lb)   SpO2 98%   BMI 21.64 kg/m   Physical Exam  Constitutional: She appears well-developed and well-nourished. No distress.  HENT:  Head: Normocephalic. Head is with contusion.  Bruising over the occipital scalp  Eyes: Conjunctivae are normal.  Neck: Neck supple.  Cardiovascular: Normal rate and regular rhythm.   No murmur heard. Pulmonary/Chest: Effort normal and breath sounds normal. No respiratory distress. She exhibits tenderness (over the left lateral chest wall with bruising present).  Abdominal: Soft. There is no tenderness.  Musculoskeletal: Normal range of motion. She exhibits no edema, tenderness or deformity.  Neurological: She is alert.  Skin: Skin is warm and dry.  Psychiatric: She has a normal mood and affect.  Nursing note and vitals reviewed.  ED Treatments / Results  Labs (all labs ordered are listed, but only abnormal results are displayed) Labs Reviewed  CBC - Abnormal; Notable for the following:       Result Value   RBC 3.41 (*)    Hemoglobin 7.8 (*)    HCT 26.7 (*)    MCH 22.9 (*)    MCHC 29.2 (*)    RDW 16.6 (*)    All other components within normal limits  BASIC METABOLIC PANEL   EKG  EKG Interpretation None      Radiology No results found.  Procedures Procedures (including critical care time)  Medications Ordered in ED Medications  acetaminophen (TYLENOL) tablet 1,000 mg (not administered)   Initial Impression /  Assessment and Plan / ED Course  I have reviewed the triage vital signs and the nursing notes.  Pertinent labs & imaging results that were available during my care of the patient were reviewed by me and considered in my medical decision making (see chart for details).  .Pt is a 83 y.o. female with pertinent PMHX HTN and anxiety who presented after a mechanical fall from standing.   The patient is elderly and has multiple comorbidities. Complaints of left sided chest wall pain and bruising to the back of the head. The patient does take Plavix .  Obtain CT head and neck and CT scan of the chest. Imaging revealed: CT head: No evidence of acute intracranial or cervical spine injury. CT C spine: No evidence of acute intracranial or cervical spine injury. CT chest: No rib fracture or other acute osseous  abnormality.  Basic labs performed. Pertinent lab studies: anemia with Hgb stable when compared to previous, BMP unremarkable  Consider the patient states is stable for discharge.  Advised to follow-up with her PCP in 3-5 days.  Given strict return precautions for any worsening pain, shortness of breath, or changes in mental status.  The plan for this patient was discussed with Dr. Reather Converse, who voiced agreement and who oversaw evaluation and treatment of this patient.   Final Clinical Impressions(s) / ED Diagnoses   Final diagnoses:  Fall in home, initial encounter  Contusion of left chest wall, initial encounter   New Prescriptions New Prescriptions   No medications on file     Fenton Foy, MD 03/13/17 Sande Rives    Elnora Morrison, MD 03/13/17 [phone]

## 2017-03-11 NOTE — ED Notes (Signed)
Patient returned from CT

## 2017-03-11 NOTE — ED Notes (Signed)
Patient taken to CT.

## 2017-03-11 NOTE — ED Triage Notes (Signed)
Family reports pt fell this morning at 5 am against her dresser. Per family pt has bruising to her left flank area. Pt went to a couple of urgent cares where one did not have radiology ability and the other one told her to come to an ED due to pt on plavix.

## 2017-03-11 NOTE — ED Notes (Signed)
Patient Alert and oriented X4. Stable and ambulatory to baseline. Patient verbalized understanding of the discharge instructions.  Patient belongings were taken by the patient.

## 2017-03-11 NOTE — ED Notes (Signed)
Patient transported to X-ray 

## 2017-03-13 DIAGNOSIS — R42 Dizziness and giddiness: Secondary | ICD-10-CM | POA: Diagnosis not present

## 2017-03-13 DIAGNOSIS — S0093XA Contusion of unspecified part of head, initial encounter: Secondary | ICD-10-CM | POA: Diagnosis not present

## 2017-03-13 DIAGNOSIS — D509 Iron deficiency anemia, unspecified: Secondary | ICD-10-CM | POA: Diagnosis not present

## 2017-03-13 DIAGNOSIS — W19XXXA Unspecified fall, initial encounter: Secondary | ICD-10-CM | POA: Diagnosis not present

## 2017-03-13 DIAGNOSIS — I129 Hypertensive chronic kidney disease with stage 1 through stage 4 chronic kidney disease, or unspecified chronic kidney disease: Secondary | ICD-10-CM | POA: Diagnosis not present

## 2017-04-02 DIAGNOSIS — D509 Iron deficiency anemia, unspecified: Secondary | ICD-10-CM | POA: Diagnosis not present

## 2017-04-17 DIAGNOSIS — H903 Sensorineural hearing loss, bilateral: Secondary | ICD-10-CM | POA: Diagnosis not present

## 2017-04-17 DIAGNOSIS — H6122 Impacted cerumen, left ear: Secondary | ICD-10-CM | POA: Diagnosis not present

## 2017-04-30 DIAGNOSIS — D509 Iron deficiency anemia, unspecified: Secondary | ICD-10-CM | POA: Diagnosis not present

## 2017-06-25 DIAGNOSIS — I10 Essential (primary) hypertension: Secondary | ICD-10-CM | POA: Diagnosis not present

## 2017-06-25 DIAGNOSIS — D649 Anemia, unspecified: Secondary | ICD-10-CM | POA: Diagnosis not present

## 2017-06-25 DIAGNOSIS — D539 Nutritional anemia, unspecified: Secondary | ICD-10-CM | POA: Diagnosis not present

## 2017-06-25 DIAGNOSIS — D509 Iron deficiency anemia, unspecified: Secondary | ICD-10-CM | POA: Diagnosis not present

## 2017-10-03 ENCOUNTER — Other Ambulatory Visit: Payer: Self-pay

## 2017-10-03 ENCOUNTER — Encounter (HOSPITAL_COMMUNITY): Payer: Self-pay | Admitting: Emergency Medicine

## 2017-10-03 ENCOUNTER — Emergency Department (HOSPITAL_COMMUNITY): Payer: Medicare Other

## 2017-10-03 ENCOUNTER — Inpatient Hospital Stay (HOSPITAL_COMMUNITY): Payer: Medicare Other

## 2017-10-03 ENCOUNTER — Inpatient Hospital Stay (HOSPITAL_COMMUNITY)
Admission: EM | Admit: 2017-10-03 | Discharge: 2017-10-13 | DRG: 375 | Disposition: E | Payer: Medicare Other | Source: Ambulatory Visit | Attending: Internal Medicine | Admitting: Internal Medicine

## 2017-10-03 DIAGNOSIS — F329 Major depressive disorder, single episode, unspecified: Secondary | ICD-10-CM | POA: Diagnosis present

## 2017-10-03 DIAGNOSIS — Z818 Family history of other mental and behavioral disorders: Secondary | ICD-10-CM

## 2017-10-03 DIAGNOSIS — N189 Chronic kidney disease, unspecified: Secondary | ICD-10-CM | POA: Diagnosis present

## 2017-10-03 DIAGNOSIS — Z7902 Long term (current) use of antithrombotics/antiplatelets: Secondary | ICD-10-CM | POA: Diagnosis not present

## 2017-10-03 DIAGNOSIS — C180 Malignant neoplasm of cecum: Secondary | ICD-10-CM | POA: Diagnosis present

## 2017-10-03 DIAGNOSIS — K5732 Diverticulitis of large intestine without perforation or abscess without bleeding: Secondary | ICD-10-CM | POA: Diagnosis present

## 2017-10-03 DIAGNOSIS — C182 Malignant neoplasm of ascending colon: Secondary | ICD-10-CM | POA: Diagnosis not present

## 2017-10-03 DIAGNOSIS — Z85828 Personal history of other malignant neoplasm of skin: Secondary | ICD-10-CM

## 2017-10-03 DIAGNOSIS — K56609 Unspecified intestinal obstruction, unspecified as to partial versus complete obstruction: Secondary | ICD-10-CM | POA: Diagnosis not present

## 2017-10-03 DIAGNOSIS — Z515 Encounter for palliative care: Secondary | ICD-10-CM | POA: Diagnosis not present

## 2017-10-03 DIAGNOSIS — E785 Hyperlipidemia, unspecified: Secondary | ICD-10-CM | POA: Diagnosis present

## 2017-10-03 DIAGNOSIS — R140 Abdominal distension (gaseous): Secondary | ICD-10-CM | POA: Diagnosis not present

## 2017-10-03 DIAGNOSIS — C184 Malignant neoplasm of transverse colon: Secondary | ICD-10-CM | POA: Diagnosis present

## 2017-10-03 DIAGNOSIS — Z8619 Personal history of other infectious and parasitic diseases: Secondary | ICD-10-CM | POA: Diagnosis not present

## 2017-10-03 DIAGNOSIS — I129 Hypertensive chronic kidney disease with stage 1 through stage 4 chronic kidney disease, or unspecified chronic kidney disease: Secondary | ICD-10-CM | POA: Diagnosis present

## 2017-10-03 DIAGNOSIS — N182 Chronic kidney disease, stage 2 (mild): Secondary | ICD-10-CM | POA: Diagnosis present

## 2017-10-03 DIAGNOSIS — F32A Depression, unspecified: Secondary | ICD-10-CM | POA: Diagnosis present

## 2017-10-03 DIAGNOSIS — N183 Chronic kidney disease, stage 3 (moderate): Secondary | ICD-10-CM | POA: Diagnosis not present

## 2017-10-03 DIAGNOSIS — Z66 Do not resuscitate: Secondary | ICD-10-CM | POA: Diagnosis not present

## 2017-10-03 DIAGNOSIS — K219 Gastro-esophageal reflux disease without esophagitis: Secondary | ICD-10-CM | POA: Diagnosis present

## 2017-10-03 DIAGNOSIS — H9190 Unspecified hearing loss, unspecified ear: Secondary | ICD-10-CM | POA: Diagnosis present

## 2017-10-03 DIAGNOSIS — F319 Bipolar disorder, unspecified: Secondary | ICD-10-CM | POA: Diagnosis present

## 2017-10-03 DIAGNOSIS — F411 Generalized anxiety disorder: Secondary | ICD-10-CM | POA: Diagnosis present

## 2017-10-03 DIAGNOSIS — R1084 Generalized abdominal pain: Secondary | ICD-10-CM

## 2017-10-03 DIAGNOSIS — Z431 Encounter for attention to gastrostomy: Secondary | ICD-10-CM | POA: Diagnosis not present

## 2017-10-03 DIAGNOSIS — F0390 Unspecified dementia without behavioral disturbance: Secondary | ICD-10-CM | POA: Diagnosis present

## 2017-10-03 DIAGNOSIS — R1030 Lower abdominal pain, unspecified: Secondary | ICD-10-CM | POA: Diagnosis not present

## 2017-10-03 DIAGNOSIS — K4030 Unilateral inguinal hernia, with obstruction, without gangrene, not specified as recurrent: Secondary | ICD-10-CM | POA: Diagnosis present

## 2017-10-03 DIAGNOSIS — Z4659 Encounter for fitting and adjustment of other gastrointestinal appliance and device: Secondary | ICD-10-CM

## 2017-10-03 DIAGNOSIS — R109 Unspecified abdominal pain: Secondary | ICD-10-CM | POA: Diagnosis not present

## 2017-10-03 DIAGNOSIS — K56699 Other intestinal obstruction unspecified as to partial versus complete obstruction: Secondary | ICD-10-CM

## 2017-10-03 DIAGNOSIS — I2510 Atherosclerotic heart disease of native coronary artery without angina pectoris: Secondary | ICD-10-CM | POA: Diagnosis present

## 2017-10-03 DIAGNOSIS — D500 Iron deficiency anemia secondary to blood loss (chronic): Secondary | ICD-10-CM | POA: Diagnosis present

## 2017-10-03 DIAGNOSIS — Z79899 Other long term (current) drug therapy: Secondary | ICD-10-CM

## 2017-10-03 DIAGNOSIS — Z820 Family history of epilepsy and other diseases of the nervous system: Secondary | ICD-10-CM | POA: Diagnosis not present

## 2017-10-03 DIAGNOSIS — I10 Essential (primary) hypertension: Secondary | ICD-10-CM | POA: Diagnosis not present

## 2017-10-03 DIAGNOSIS — J90 Pleural effusion, not elsewhere classified: Secondary | ICD-10-CM | POA: Diagnosis not present

## 2017-10-03 DIAGNOSIS — Z8673 Personal history of transient ischemic attack (TIA), and cerebral infarction without residual deficits: Secondary | ICD-10-CM | POA: Diagnosis not present

## 2017-10-03 HISTORY — DX: Disorder of kidney and ureter, unspecified: N28.9

## 2017-10-03 HISTORY — DX: Enterocolitis due to Clostridium difficile, not specified as recurrent: A04.72

## 2017-10-03 HISTORY — DX: Dysphagia, unspecified: R13.10

## 2017-10-03 HISTORY — DX: Unspecified hearing loss, unspecified ear: H91.90

## 2017-10-03 HISTORY — DX: Atherosclerotic heart disease of native coronary artery without angina pectoris: I25.10

## 2017-10-03 HISTORY — DX: Gastro-esophageal reflux disease without esophagitis: K21.9

## 2017-10-03 HISTORY — DX: Unspecified malignant neoplasm of skin, unspecified: C44.90

## 2017-10-03 LAB — COMPREHENSIVE METABOLIC PANEL
ALT: 11 U/L — AB (ref 14–54)
AST: 22 U/L (ref 15–41)
Albumin: 3.7 g/dL (ref 3.5–5.0)
Alkaline Phosphatase: 83 U/L (ref 38–126)
Anion gap: 7 (ref 5–15)
BUN: 35 mg/dL — ABNORMAL HIGH (ref 6–20)
CALCIUM: 9.7 mg/dL (ref 8.9–10.3)
CHLORIDE: 108 mmol/L (ref 101–111)
CO2: 23 mmol/L (ref 22–32)
Creatinine, Ser: 1.22 mg/dL — ABNORMAL HIGH (ref 0.44–1.00)
GFR calc non Af Amer: 38 mL/min — ABNORMAL LOW (ref >60)
GFR, EST AFRICAN AMERICAN: 44 mL/min — AB (ref >60)
Glucose, Bld: 133 mg/dL — ABNORMAL HIGH (ref 65–99)
Potassium: 4 mmol/L (ref 3.5–5.1)
Sodium: 138 mmol/L (ref 135–145)
TOTAL PROTEIN: 7.4 g/dL (ref 6.5–8.1)
Total Bilirubin: 0.4 mg/dL (ref 0.3–1.2)

## 2017-10-03 LAB — CBC WITH DIFFERENTIAL/PLATELET
ABS IMMATURE GRANULOCYTES: 0 10*3/uL (ref 0.0–0.1)
BASOS PCT: 0 %
Basophils Absolute: 0 10*3/uL (ref 0.0–0.1)
Eosinophils Absolute: 0.1 10*3/uL (ref 0.0–0.7)
Eosinophils Relative: 1 %
HCT: 27.7 % — ABNORMAL LOW (ref 36.0–46.0)
Hemoglobin: 8.3 g/dL — ABNORMAL LOW (ref 12.0–15.0)
IMMATURE GRANULOCYTES: 0 %
LYMPHS PCT: 14 %
Lymphs Abs: 1 10*3/uL (ref 0.7–4.0)
MCH: 25.9 pg — AB (ref 26.0–34.0)
MCHC: 30 g/dL (ref 30.0–36.0)
MCV: 86.3 fL (ref 78.0–100.0)
Monocytes Absolute: 0.5 10*3/uL (ref 0.1–1.0)
Monocytes Relative: 7 %
NEUTROS ABS: 5.5 10*3/uL (ref 1.7–7.7)
NEUTROS PCT: 78 %
PLATELETS: 186 10*3/uL (ref 150–400)
RBC: 3.21 MIL/uL — AB (ref 3.87–5.11)
RDW: 13.5 % (ref 11.5–15.5)
WBC: 7 10*3/uL (ref 4.0–10.5)

## 2017-10-03 LAB — LIPASE, BLOOD: Lipase: 45 U/L (ref 11–51)

## 2017-10-03 LAB — I-STAT TROPONIN, ED: TROPONIN I, POC: 0 ng/mL (ref 0.00–0.08)

## 2017-10-03 LAB — I-STAT CG4 LACTIC ACID, ED: LACTIC ACID, VENOUS: 1.21 mmol/L (ref 0.5–1.9)

## 2017-10-03 MED ORDER — ACETAMINOPHEN 325 MG PO TABS: 650.0000 mg | ORAL_TABLET | Freq: Four times a day (QID) | ORAL | Status: DC | PRN

## 2017-10-03 MED ORDER — BIOTENE DRY MOUTH MT LIQD: 15.0000 mL | OROMUCOSAL | Status: DC | PRN

## 2017-10-03 MED ORDER — GLYCOPYRROLATE 0.2 MG/ML IJ SOLN: 0.2000 mg | INTRAMUSCULAR | Status: DC | PRN

## 2017-10-03 MED ORDER — PIPERACILLIN-TAZOBACTAM 3.375 G IVPB: 3.3750 g | Freq: Three times a day (TID) | INTRAVENOUS | Status: DC

## 2017-10-03 MED ORDER — OCTREOTIDE ACETATE 100 MCG/ML IJ SOLN
100.0000 ug | Freq: Three times a day (TID) | INTRAMUSCULAR | Status: DC | PRN
  Filled 2017-10-03: qty 1

## 2017-10-03 MED ORDER — HALOPERIDOL LACTATE 2 MG/ML PO CONC
0.5000 mg | ORAL | Status: DC | PRN
Start: 2017-10-03 — End: 2017-10-07
  Filled 2017-10-03: qty 0.3

## 2017-10-03 MED ORDER — LORAZEPAM 2 MG/ML IJ SOLN
0.2500 mg | Freq: Once | INTRAMUSCULAR | Status: AC
  Administered 2017-10-03: 0.25 mg via INTRAVENOUS
  Filled 2017-10-03: qty 1

## 2017-10-03 MED ORDER — HALOPERIDOL 0.5 MG PO TABS
0.5000 mg | ORAL_TABLET | ORAL | Status: DC | PRN
  Filled 2017-10-03: qty 1

## 2017-10-03 MED ORDER — IOHEXOL 300 MG/ML  SOLN
80.0000 mL | Freq: Once | INTRAMUSCULAR | Status: AC | PRN
  Administered 2017-10-03: 80 mL via INTRAVENOUS

## 2017-10-03 MED ORDER — ACETAMINOPHEN 325 MG PO TABS
650.0000 mg | ORAL_TABLET | Freq: Once | ORAL | Status: AC
  Administered 2017-10-03: 650 mg via ORAL
  Filled 2017-10-03: qty 2

## 2017-10-03 MED ORDER — SODIUM CHLORIDE 0.9 % IV BOLUS
500.0000 mL | Freq: Once | INTRAVENOUS | Status: AC
  Administered 2017-10-03: 500 mL via INTRAVENOUS

## 2017-10-03 MED ORDER — SODIUM CHLORIDE 0.9 % IV SOLN: 250.0000 mL | INTRAVENOUS | Status: DC | PRN

## 2017-10-03 MED ORDER — POLYVINYL ALCOHOL 1.4 % OP SOLN: 1.0000 [drp] | Freq: Four times a day (QID) | OPHTHALMIC | Status: DC | PRN

## 2017-10-03 MED ORDER — ONDANSETRON HCL 4 MG/2ML IJ SOLN
4.0000 mg | Freq: Once | INTRAMUSCULAR | Status: AC
  Administered 2017-10-03: 4 mg via INTRAVENOUS
  Filled 2017-10-03: qty 2

## 2017-10-03 MED ORDER — HYDROMORPHONE HCL 2 MG/ML IJ SOLN
0.2500 mg | Freq: Once | INTRAMUSCULAR | Status: AC
  Administered 2017-10-03: 0.3 mg via INTRAVENOUS

## 2017-10-03 MED ORDER — SODIUM CHLORIDE 0.9% FLUSH
3.0000 mL | Freq: Two times a day (BID) | INTRAVENOUS | Status: DC
Start: 2017-10-03 — End: 2017-10-07
  Administered 2017-10-04 – 2017-10-06 (×6): 3 mL via INTRAVENOUS

## 2017-10-03 MED ORDER — HYDROMORPHONE HCL 2 MG/ML IJ SOLN
0.5000 mg | INTRAMUSCULAR | Status: DC | PRN
  Administered 2017-10-04 – 2017-10-07 (×4): 0.5 mg via INTRAVENOUS
  Filled 2017-10-03 (×4): qty 1

## 2017-10-03 MED ORDER — DIPHENHYDRAMINE HCL 50 MG/ML IJ SOLN: 12.5000 mg | INTRAMUSCULAR | Status: DC | PRN

## 2017-10-03 MED ORDER — ONDANSETRON HCL 4 MG/2ML IJ SOLN: 4.0000 mg | Freq: Four times a day (QID) | INTRAMUSCULAR | Status: DC | PRN

## 2017-10-03 MED ORDER — ACETAMINOPHEN 650 MG RE SUPP: 650.0000 mg | Freq: Four times a day (QID) | RECTAL | Status: DC | PRN

## 2017-10-03 MED ORDER — HYDROMORPHONE HCL 2 MG/ML IJ SOLN
0.2500 mg | INTRAMUSCULAR | Status: DC | PRN
Start: 2017-10-03 — End: 2017-10-07
  Administered 2017-10-04: 0.3 mg via INTRAVENOUS
  Filled 2017-10-03 (×2): qty 1

## 2017-10-03 MED ORDER — MAGIC MOUTHWASH W/LIDOCAINE
15.0000 mL | Freq: Four times a day (QID) | ORAL | Status: DC | PRN
  Filled 2017-10-03: qty 15

## 2017-10-03 MED ORDER — HALOPERIDOL LACTATE 5 MG/ML IJ SOLN: 0.5000 mg | INTRAMUSCULAR | Status: DC | PRN

## 2017-10-03 MED ORDER — SODIUM CHLORIDE 0.9% FLUSH: 3.0000 mL | INTRAVENOUS | Status: DC | PRN

## 2017-10-03 MED ORDER — ONDANSETRON 4 MG PO TBDP
4.0000 mg | ORAL_TABLET | Freq: Four times a day (QID) | ORAL | Status: DC | PRN
Start: 2017-10-03 — End: 2017-10-07

## 2017-10-03 MED ORDER — LORAZEPAM 2 MG/ML IJ SOLN
0.5000 mg | INTRAMUSCULAR | Status: DC | PRN
  Administered 2017-10-04: 0.5 mg via INTRAVENOUS
  Filled 2017-10-03: qty 1

## 2017-10-03 MED ORDER — GLYCOPYRROLATE 0.2 MG/ML IJ SOLN
0.2000 mg | INTRAMUSCULAR | Status: DC | PRN
  Administered 2017-10-06 – 2017-10-07 (×3): 0.2 mg via INTRAVENOUS
  Filled 2017-10-03 (×3): qty 1

## 2017-10-03 MED ORDER — PIPERACILLIN-TAZOBACTAM 3.375 G IVPB 30 MIN: 3.3750 g | Freq: Once | INTRAVENOUS | Status: DC

## 2017-10-03 NOTE — Progress Notes (Signed)
Pharmacy Antibiotic Note  SHANTIL VALLEJO is a 84 y.o. female admitted on 10/06/2017 with intra-abdominal infection.  Pharmacy has been consulted for Zosyn dosing. WBC wnl. SCr 1.22. CrCl ~ 30 mL/min   Plan: -Zosyn 3.375 gm IV Q 8 hours (EI infusion) -Monitor for clinical improvement   Height: 4\' 9"  (144.8 cm) Weight: 101 lb (45.8 kg) IBW/kg (Calculated) : 38.6  Temp (24hrs), Avg:98 F (36.7 C), Min:97.9 F (36.6 C), Max:98.1 F (36.7 C)  Recent Labs  Lab 09/28/2017 1403 09/30/2017 1443  WBC 7.0  --   CREATININE 1.22*  --   LATICACIDVEN  --  1.21    Estimated Creatinine Clearance: 18.3 mL/min (A) (by C-G formula based on SCr of 1.22 mg/dL (H)).    No Known Allergies   Thank you for allowing pharmacy to be a part of this patient's care.  Albertina Parr, PharmD., BCPS Clinical Pharmacist Clinical phone for 09/12/2017 until 11pm: [phone] If after 11pm, please call main pharmacy at: [phone]

## 2017-10-03 NOTE — ED Triage Notes (Addendum)
Pt sent by PCP for abdominal work up for decreased bowel sounds with abdominal pain. Pt has swelling to abdomen noted with gurgling sound audible without a stethoscope. Pt has had the pain off and on for 2 weeks.

## 2017-10-03 NOTE — Consult Note (Signed)
Reason for Consult: colonic distension Referring Physician: Sena Hoopingarner is an 84 y.o. female.  HPI: 84 yo female with 2 days of abdominal swelling and pain. Pain began last night and was treated with tylenol and she was able to sleep through the night. She noticed her abdomen was enlarging and was concerned she was pregnant. She had a regular bowel movement yesterday but nothing today. She denies nausea or vomiting. She lives with her daughter and is able to move around the house with a walker.  Past Medical History:  Diagnosis Date  . Bipolar disorder ([place])   . C. difficile colitis   . Coronary artery disease   . Depression   . Dysphagia   . GERD (gastroesophageal reflux disease)   . Hard of hearing   . Hypercholesteremia   . Hypertension   . Renal disorder   . Skin cancer   . Stroke [facility])     No past surgical history on file.  No family history on file.  Social History:  reports that she has never smoked. She has never used smokeless tobacco. She reports that she does not drink alcohol or use drugs.  Allergies: No Known Allergies  Medications: I have reviewed the patient's current medications.  Results for orders placed or performed during the hospital encounter of 10/02/2017 (from the past 48 hour(s))  CBC with Differential     Status: Abnormal   Collection Time: 09/12/2017  2:03 PM  Result Value Ref Range   WBC 7.0 4.0 - 10.5 K/uL   RBC 3.21 (L) 3.87 - 5.11 MIL/uL   Hemoglobin 8.3 (L) 12.0 - 15.0 g/dL   HCT 27.7 (L) 36.0 - 46.0 %   MCV 86.3 78.0 - 100.0 fL   MCH 25.9 (L) 26.0 - 34.0 pg   MCHC 30.0 30.0 - 36.0 g/dL   RDW 13.5 11.5 - 15.5 %   Platelets 186 150 - 400 K/uL   Neutrophils Relative % 78 %   Neutro Abs 5.5 1.7 - 7.7 K/uL   Lymphocytes Relative 14 %   Lymphs Abs 1.0 0.7 - 4.0 K/uL   Monocytes Relative 7 %   Monocytes Absolute 0.5 0.1 - 1.0 K/uL   Eosinophils Relative 1 %   Eosinophils Absolute 0.1 0.0 - 0.7 K/uL   Basophils Relative 0 %    Basophils Absolute 0.0 0.0 - 0.1 K/uL   Immature Granulocytes 0 %   Abs Immature Granulocytes 0.0 0.0 - 0.1 K/uL    Comment: Performed at [facility], [address] [place], [place] [address]  Comprehensive metabolic panel     Status: Abnormal   Collection Time: 10/11/2017  2:03 PM  Result Value Ref Range   Sodium 138 135 - 145 mmol/L   Potassium 4.0 3.5 - 5.1 mmol/L   Chloride 108 101 - 111 mmol/L   CO2 23 22 - 32 mmol/L   Glucose, Bld 133 (H) 65 - 99 mg/dL   BUN 35 (H) 6 - 20 mg/dL   Creatinine, Ser 1.22 (H) 0.44 - 1.00 mg/dL   Calcium 9.7 8.9 - 10.3 mg/dL   Total Protein 7.4 6.5 - 8.1 g/dL   Albumin 3.7 3.5 - 5.0 g/dL   AST 22 15 - 41 U/L   ALT 11 (L) 14 - 54 U/L   Alkaline Phosphatase 83 38 - 126 U/L   Total Bilirubin 0.4 0.3 - 1.2 mg/dL   GFR calc non Af Amer 38 (L) >60 mL/min  GFR calc Af Amer 44 (L) >60 mL/min    Comment: (NOTE) The eGFR has been calculated using the CKD EPI equation. This calculation has not been validated in all clinical situations. eGFR's persistently <60 mL/min signify possible Chronic Kidney Disease.    Anion gap 7 5 - 15    Comment: Performed at [facility] [address], [place], [place] [address]  Lipase, blood     Status: None   Collection Time: 10/12/2017  2:03 PM  Result Value Ref Range   Lipase 45 11 - 51 U/L    Comment: Performed at [facility] [address], [place], [place] [address]  I-stat troponin, ED     Status: None   Collection Time: 10/08/2017  2:40 PM  Result Value Ref Range   Troponin i, poc 0.00 0.00 - 0.08 ng/mL   Comment 3            Comment: Due to the release kinetics of cTnI, a negative result within the first hours of the onset of symptoms does not rule out myocardial infarction with certainty. If myocardial infarction is still suspected, repeat the test at appropriate intervals.   I-Stat CG4 Lactic Acid, ED     Status: None   Collection Time: 10/02/2017  2:43 PM  Result Value Ref Range    Lactic Acid, Venous 1.21 0.5 - 1.9 mmol/L    Ct Abdomen Pelvis W Contrast  Result Date: 10/01/2017 CLINICAL DATA:  Abdominal pain with nausea EXAM: CT ABDOMEN AND PELVIS WITH CONTRAST TECHNIQUE: Multidetector CT imaging of the abdomen and pelvis was performed using the standard protocol following bolus administration of intravenous contrast. CONTRAST:  17m OMNIPAQUE IOHEXOL 300 MG/ML  SOLN COMPARISON:  CT abdomen pelvis 03/28/2015 FINDINGS: LOWER CHEST: Trace right pleural effusion with bibasilar atelectasis. HEPATOBILIARY: Normal hepatic contours and density. No intra- or extrahepatic biliary dilatation. Normal gallbladder. There is a small amount of perihepatic free fluid. PANCREAS: Normal parenchymal contours without ductal dilatation. No peripancreatic fluid collection. SPLEEN: Normal. ADRENALS/URINARY TRACT: --Adrenal glands: Normal. --Right kidney/ureter: No hydronephrosis, nephroureterolithiasis, perinephric stranding or solid renal mass. --Left kidney/ureter: No hydronephrosis, nephroureterolithiasis, perinephric stranding or solid renal mass. --Urinary bladder: Normal for degree of distention STOMACH/BOWEL: --Stomach/Duodenum: No hiatal hernia or other gastric abnormality. Normal duodenal course. --Small bowel: No dilatation or inflammation. --Colon: At the splenic flexure, there is a high-grade colonic stenosis with hyperenhancement of the wall, concerning for a colonic mass. The entirety of the colon proximal to this point is markedly dilated and filled with fluid. The cecum is massively distended. There is sigmoid diverticulosis without acute inflammation. The distal colon is decompressed. --Appendix: Not clearly visualized. VASCULAR/LYMPHATIC: Atherosclerotic calcification is present within the non-aneurysmal abdominal aorta, without hemodynamically significant stenosis. The portal vein, splenic vein, superior mesenteric vein and IVC are patent. No abdominal or pelvic lymphadenopathy.  REPRODUCTIVE: Normal uterus and ovaries. MUSCULOSKELETAL. No bony spinal canal stenosis or focal osseous abnormality. OTHER: There is fluid tracking into a right inguinal hernia, unchanged. IMPRESSION: 1. High-grade colonic stricture at the splenic flexure with severe dilatation of the cecum, ascending colon and transverse colon. This is highly concerning for a colonic carcinoma at this location. 2. Small amount of free fluid in the abdomen. 3. Trace right pleural effusion. Electronically Signed   By: KUlyses JarredM.D.   On: 10/02/2017 17:42    Review of Systems  Constitutional: Negative for chills and fever.  HENT: Positive for hearing loss.   Eyes: Negative for blurred  vision and double vision.  Respiratory: Positive for shortness of breath. Negative for cough and hemoptysis.   Cardiovascular: Negative for chest pain and palpitations.  Gastrointestinal: Positive for abdominal pain. Negative for diarrhea, nausea and vomiting.  Genitourinary: Negative for dysuria and urgency.  Musculoskeletal: Negative for myalgias and neck pain.  Skin: Negative for itching and rash.  Neurological: Negative for dizziness, tingling and headaches.  Endo/Heme/Allergies: Does not bruise/bleed easily.   Blood pressure (!) 155/77, pulse 78, temperature 98.1 F (36.7 C), temperature source Oral, resp. rate 20, height '4\' 9"'$  (1.448 m), weight 45.8 kg (101 lb), SpO2 95 %. Physical Exam  Vitals reviewed. Constitutional: She is oriented to person, place, and time. She appears well-developed and well-nourished.  HENT:  Head: Normocephalic and atraumatic.  Eyes: Pupils are equal, round, and reactive to light. Conjunctivae and EOM are normal.  Neck: Normal range of motion. Neck supple.  Cardiovascular: Normal rate and regular rhythm.  Respiratory: Effort normal and breath sounds normal.  GI: Soft. Bowel sounds are normal. She exhibits distension. There is tenderness in the right lower quadrant. There is guarding.   Musculoskeletal: Normal range of motion.  Neurological: She is alert and oriented to person, place, and time.  Skin: Skin is warm and dry.  Psychiatric: She has a normal mood and affect. Her behavior is normal.    Assessment/Plan: 84 yo female with abdominal pain and distension with CT scan findings of dilated right and transverse colon with transition point at splenic flexure concerning for mass. Pneumatosis seen in right colon as well as dilation to 8cm. Localized right sided tenderness concerning. No leukocytosis. -discussed with family about possible options for treatment and that standard treatment for cure would involve surgical resection with creation of colostomy vs ileostomy and expected long hospital stay -I think gastroenterology may be able to provide alternatives or perform endoscopy for definitive diagnosis for improved decision making. Given location, therapeutic endoscopy is less likely -will give IV abx now -if become nauseated/vomits, recommend NG tube if agreeable -DNR in place -at this time, family thinking more towards medical management and palliation, which I think is very reasonable  Arta Bruce Vikki Gains 10/09/2017, 7:44 PM

## 2017-10-03 NOTE — ED Provider Notes (Signed)
Patient placed in Quick Look pathway, seen and evaluated   Chief Complaint: Abdominal pain  HPI:   TAJE TONDREAU is a 84 y.o. female, presenting to the ED with intermittent abdominal pain for the last 2 weeks.  Pain seems to be periumbilical, feels like "grabbing," 10/10, radiating throughout the abdomen.  Was seen by her PCP today and sent to the ED for further evaluation.  She has been eating regularly and having regular bowel movements, with last bowel movement yesterday, which was normal.  Accompanied by nausea and abdominal distention. Denies fever, vomiting, diarrhea, hematochezia/melena, shortness of breath, chest pain, urinary symptoms.  ROS: Abdominal pain   Physical Exam:   Gen: Patient appears quite uncomfortable  Neuro: Awake and Alert  Skin: Warm    Focused Exam:   No diaphoresis.  No pallor.  Pulmonary: No increased work of breathing.  Speaks in full sentences without difficulty.  No tachypnea.  Cardiac: Normal rate and regular. Peripheral pulses intact.  Abdominal: Tenderness centered in the periumbilical region, but tenderness, distention, and increased tension throughout the abdomen.   Accompanied by guarding.    MSK: No peripheral edema.   Advised triage RN to give patient next available room.  Initiation of care has begun. The patient has been counseled on the process, plan, and necessity for staying for the completion/evaluation, and the remainder of the medical screening examination   Layla Maw 09/18/2017 1409    Carmin Muskrat, MD 10/05/2017 1626

## 2017-10-03 NOTE — H&P (Addendum)
History and Physical    [name] ERX:[id number] DOB: 04-26-1924 DOA: 09/18/2017  PCP: Victoria Orn, MD   Patient coming from: [place] care physician's office I have personally briefly reviewed patient's old medical records in [place]  Chief Complaint: Abdominal pain for 2 weeks much worse today  HPI: [name] is a 84 y.o. female with medical history significant of hypertension, hyperlipidemia, stroke of the left basal ganglia, bipolar depression, who presents the emergency department today with complaints of abdominal pain which is been subacute for the past 2 weeks but became significantly worse today.  She has not had any nausea or vomiting she had a last bowel movement yesterday.  Her abdomen is very obviously distended and tympanitic.  She denies any chest pain or shortness of breath.  Pain has been intermittent for 2 weeks.  Its associated with a gurgling sound that can be audibly heard.  Located in the periumbilical area and it feels like a grabbing pain.  It is spasmodic and when it occurs it is 10 out of 10 and radiates throughout the abdomen.  Was seen by her primary care provider Dr. Lavone Haas today referred her to the emergency room for further evaluation.  She has been eating regularly and having regular bowel movements.  Her last bowel movement was yesterday.  She had some slight nausea which was relieved with Zofran she continues to have abdominal distention.  Had no hematochezia or melena, no shortness of breath chest pain or urinary symptoms no dysuria urinary frequency or urgency and no unilateral weakness   Review of Systems: As per HPI otherwise all other systems reviewed and  negative.  It is hard of hearing   Past Medical History:  Diagnosis Date  . Bipolar disorder ([place])   . C. difficile colitis   . Coronary artery disease   . Depression   . Dysphagia   . GERD (gastroesophageal reflux disease)   . Hard of hearing   . Hypercholesteremia   .  Hypertension   . Renal disorder   . Skin cancer   . Stroke [facility])     No past surgical history on file.  Social History   Social History Narrative  . Not on file     reports that she has never smoked. She has never used smokeless tobacco. She reports that she does not drink alcohol or use drugs.  Has been widowed for 17 years  No Known Allergies  No family history on file. Patient was raised by a foster family and does not know her parents history.  She had some siblings that had cancer.  1 of her children has bipolar disorder, 1 of her children's had MS, other is healthy.  One child died of spina bifida. Prior to Admission medications   Not on File    Physical Exam:  Constitutional: Frail elderly white female who appears very uncomfortable and is very hard of hearing.  No respiratory distress. Vitals:   10/09/2017 2000 10/05/2017 2100 09/29/2017 2200 09/17/2017 2230  BP: (!) 156/68 (!) 149/80 (!) 141/71 111/63  Pulse: 78 80 84 78  Resp: (!) 21 (!) 22 18 19   Temp:      TempSrc:      SpO2: 94% 95% 91% 92%  Weight:      Height:       Eyes: PERRL, lids and conjunctivae normal ENMT: Mucous membranes are dry. Posterior pharynx clear of any exudate or lesions.Normal dentition.  Neck: normal, supple, no  masses, no thyromegaly Respiratory: clear to auscultation bilaterally, no wheezing, no crackles. Normal respiratory effort. No accessory muscle use.  Cardiovascular: Regular rate and rhythm, no murmurs / rubs / gallops. No extremity edema. 2+ pedal pulses. No carotid bruits.  Abdomen: Tender to palpation diffusely, no masses palpated, distended and tympanitic no hepatosplenomegaly. Musculoskeletal: no clubbing / cyanosis. No joint deformity upper and lower extremities. Good ROM, no contractures. Normal muscle tone.  Skin: no rashes, lesions, ulcers. No induration Neurologic: CN 2-12 grossly intact. Sensation intact, DTR normal. Strength 5/5 in all 4.  Psychiatric: Normal judgment and  insight. Alert and oriented x 3. Normal mood.   Labs on Admission: I have personally reviewed following labs and imaging studies  CBC: Recent Labs  Lab 09/27/2017 1403  WBC 7.0  NEUTROABS 5.5  HGB 8.3*  HCT 27.7*  MCV 86.3  PLT 062   Basic Metabolic Panel: Recent Labs  Lab 09/14/2017 1403  NA 138  K 4.0  CL 108  CO2 23  GLUCOSE 133*  BUN 35*  CREATININE 1.22*  CALCIUM 9.7   GFR: Estimated Creatinine Clearance: 18.3 mL/min (A) (by C-G formula based on SCr of 1.22 mg/dL (H)). Liver Function Tests: Recent Labs  Lab 09/22/2017 1403  AST 22  ALT 11*  ALKPHOS 83  BILITOT 0.4  PROT 7.4  ALBUMIN 3.7   Recent Labs  Lab 09/17/2017 1403  LIPASE 45   Urine analysis:    Component Value Date/Time   COLORURINE YELLOW 03/27/2015 [place] 03/27/2015 1653   LABSPEC 1.017 03/27/2015 1653   PHURINE 6.0 03/27/2015 1653   GLUCOSEU NEGATIVE 03/27/2015 1653   HGBUR TRACE (A) 03/27/2015 1653   BILIRUBINUR NEGATIVE 03/27/2015 1653   KETONESUR NEGATIVE 03/27/2015 1653   PROTEINUR NEGATIVE 03/27/2015 1653   UROBILINOGEN 0.2 03/27/2015 1653   NITRITE NEGATIVE 03/27/2015 1653   LEUKOCYTESUR NEGATIVE 03/27/2015 1653    Radiological Exams on Admission: Ct Abdomen Pelvis W Contrast  Result Date: 10/02/2017 CLINICAL DATA:  Abdominal pain with nausea EXAM: CT ABDOMEN AND PELVIS WITH CONTRAST TECHNIQUE: Multidetector CT imaging of the abdomen and pelvis was performed using the standard protocol following bolus administration of intravenous contrast. CONTRAST:  47mL OMNIPAQUE IOHEXOL 300 MG/ML  SOLN COMPARISON:  CT abdomen pelvis 03/28/2015 FINDINGS: LOWER CHEST: Trace right pleural effusion with bibasilar atelectasis. HEPATOBILIARY: Normal hepatic contours and density. No intra- or extrahepatic biliary dilatation. Normal gallbladder. There is a small amount of perihepatic free fluid. PANCREAS: Normal parenchymal contours without ductal dilatation. No peripancreatic fluid  collection. SPLEEN: Normal. ADRENALS/URINARY TRACT: --Adrenal glands: Normal. --Right kidney/ureter: No hydronephrosis, nephroureterolithiasis, perinephric stranding or solid renal mass. --Left kidney/ureter: No hydronephrosis, nephroureterolithiasis, perinephric stranding or solid renal mass. --Urinary bladder: Normal for degree of distention STOMACH/BOWEL: --Stomach/Duodenum: No hiatal hernia or other gastric abnormality. Normal duodenal course. --Small bowel: No dilatation or inflammation. --Colon: At the splenic flexure, there is a high-grade colonic stenosis with hyperenhancement of the wall, concerning for a colonic mass. The entirety of the colon proximal to this point is markedly dilated and filled with fluid. The cecum is massively distended. There is sigmoid diverticulosis without acute inflammation. The distal colon is decompressed. --Appendix: Not clearly visualized. VASCULAR/LYMPHATIC: Atherosclerotic calcification is present within the non-aneurysmal abdominal aorta, without hemodynamically significant stenosis. The portal vein, splenic vein, superior mesenteric vein and IVC are patent. No abdominal or pelvic lymphadenopathy. REPRODUCTIVE: Normal uterus and ovaries. MUSCULOSKELETAL. No bony spinal canal stenosis or focal osseous abnormality. OTHER: There is fluid tracking into a  right inguinal hernia, unchanged. IMPRESSION: 1. High-grade colonic stricture at the splenic flexure with severe dilatation of the cecum, ascending colon and transverse colon. This is highly concerning for a colonic carcinoma at this location. 2. Small amount of free fluid in the abdomen. 3. Trace right pleural effusion. Electronically Signed   By: Ulyses Jarred M.D.   On: 09/29/2017 17:42   Dg Chest Port 1 View  Result Date: 09/20/2017 CLINICAL DATA:  NG tube EXAM: PORTABLE CHEST 1 VIEW COMPARISON:  03/11/2017 FINDINGS: Esophageal tube is folded back upon itself in the region of the GE junction. Small pleural effusions.  Bibasilar airspace disease. Aortic atherosclerosis. IMPRESSION: 1. Esophageal tube tip is folded back upon itself with the tip directed cephalad, in the region of the GE junction 2. Small pleural effusions with bibasilar airspace disease Electronically Signed   By: Donavan Foil M.D.   On: 09/17/2017 22:19    Personally reviewed her CT scan which showed massive colonic distention with extra at the splenic flexure with severe dilatation of the cecum a sending colon and transverse colon.  Assessment/Plan Principal Problem:   Large bowel obstruction (HCC) Active Problems:   Chronic kidney disease   Anxiety state   Iron deficiency anemia due to chronic blood loss   Hypertension   Hyperlipidemia   Hx of arterial ischemic stroke   Depression   Bipolar disorder ([place])   1..  Large bowel obstruction: Likely due to a mass probably a tumor.  Patient does have evidence of diverticulitis but this does not appear to be a diverticular obstruction or abscess.  Further evidence for mass includes her chronic iron deficiency anemia which is noted in her medical records.  Discussed at length with general surgery and the patient's family.  Patient became very anxious at discussions about surgery or colonoscopy.  She really does not want to have either of these done.  Based on this her son who is her power of attorney has requested that we keep the patient comfortable.  We will place an NG tube to intermittent low wall suction given the amount of material noted in the colon and the distention.  This should help with discomfort from distention.  Will treat symptoms and admit to the palliative care ward.  2.  Chronic kidney disease: This is noted current creatinine is 0.22 which appears to be approximately her baseline.  3.  Anxiety state: Patient is very nervous IV Ativan is available for her.  We are going to give her some IV Ativan before placing her NG tube.  4.  Iron deficiency anemia due to chronic blood loss:  Patient likely has a colon mass.  There are no plans to further evaluate this.  Patient is very anxious at the idea of surgery and she even said to me I am 84 years old and I do not wish to have any surgery.  5.  Hypertension: Blood pressure stable we will continue to monitor.  If blood pressures are elevated will order IV blood pressure medication.  6.  Hyperlipidemia: We will hold statin medication at this time.  7.  History of arterial ischemic stroke noted we will hold aspirin at this time.  8.  Depression and bipolar disorder: We will have to hold medications if necessary can give IV Haldol or IV Geodon.  9.  End-of-life care: Patient is admitted to palliative care ward.  We have consulted palliative care she will need ongoing symptom management.  Had extensive discussion with the patient's son  and his wife regarding patient's current situation and that she will likely pass from this.  The plan is to keep her comfortable.  DVT prophylaxis: Not indicated Code Status: DO NOT RESUSCITATE Family Communication: Spoke at length with patient's son, [name], and daughter-in-law Disposition Plan: Possibly hospice house Consults called: Palliative care Admission status: Inpatient   Lady Deutscher MD [facility] Pager [phone]  If 7PM-7AM, please contact night-coverage [email or web address] Password [facility]  10/02/2017, 11:55 PM

## 2017-10-03 NOTE — ED Provider Notes (Signed)
[facility] EMERGENCY DEPARTMENT Provider Note   CSN: [id number] Arrival date & time: 10/12/2017  1345     History   Chief Complaint Chief Complaint  Patient presents with  . Abdominal Pain    HPI [name] is a 84 y.o. female history of CAD, C. difficile colitis, reflux, previous stroke here presenting with abdominal pain, distention.  Patient was at home with family.  Patient has been having intermittent lower abdominal pain for the last several days.  However since patient is demented so family assumed that it was gas.  Since yesterday, her pain increased.  Family called her daughter-in-law, who is a Equities trader.  She came and saw the patient this morning and noticed that she seemed to be in a lot of pain.  Was able to tolerate some breakfast this morning with no vomiting and had a normal bowel movement last night.  She was noted to have a reducible inguinal hernia but was noted to have a lot of pain in triage and increased bowel movements.  Patient has no previous history of small bowel obstruction.    The history is provided by the patient and a relative.  Level V caveat- dementia   Past Medical History:  Diagnosis Date  . Bipolar disorder ([place])   . C. difficile colitis   . Coronary artery disease   . Depression   . Dysphagia   . GERD (gastroesophageal reflux disease)   . Hard of hearing   . Hypercholesteremia   . Hypertension   . Renal disorder   . Skin cancer   . Stroke [facility])     Patient Active Problem List   Diagnosis Date Noted  . Esophageal reflux   . Metabolic acidosis   . Enteritis due to Clostridium difficile   . Anemia, iron deficiency 03/28/2015  . Diarrhea 03/28/2015  . AKI (acute kidney injury) ([place]) 03/27/2015  . Hx of arterial ischemic stroke 03/27/2015  . Anxiety state 03/27/2015  . Depression 03/27/2015  . Bipolar disorder ([place]) 03/27/2015  . Fever, unspecified 03/27/2015  . Hyponatremia 03/27/2015  . Hyponatremia with  extracellular fluid depletion 03/27/2015  . Hx: UTI (urinary tract infection) 03/27/2015  . Chronic kidney disease 04/23/2013  . CVA (cerebral infarction) 04/22/2013  . Hypertension 04/22/2013  . Hyperlipidemia 04/22/2013    No past surgical history on file.   OB History   None      Home Medications    Prior to Admission medications   Medication Sig Start Date End Date Taking? Authorizing Provider  amLODipine-olmesartan (AZOR) 10-40 MG tablet Take 1 tablet by mouth at bedtime.    Yes [provider]  atorvastatin (LIPITOR) 10 MG tablet Take 10 mg by mouth daily.   Yes [provider]  clopidogrel (PLAVIX) 75 MG tablet Take 75 mg by mouth daily with breakfast.   Yes [provider]  docusate sodium (COLACE) 50 MG capsule Take 1 capsule (50 mg total) by mouth daily as needed for moderate constipation (hold for diarrhea). Patient taking differently: Take 50 mg by mouth See admin instructions. Take one tablet (50mg ) once daily, may take another tablet if needed 03/31/15  Yes Barton Dubois, MD  famotidine (PEPCID) 20 MG tablet Take 1 tablet (20 mg total) by mouth 2 (two) times daily. 03/31/15  Yes Barton Dubois, MD  lactose free nutrition (BOOST) LIQD Take 237 mLs by mouth daily.   Yes [provider]  LORazepam (ATIVAN) 0.5 MG tablet Take 0.25 mg by  mouth daily as needed for anxiety (pt takes a 1/2 of a 0.5 tablet).    Yes [provider]  QUEtiapine (SEROQUEL) 50 MG tablet Take 50 mg by mouth at bedtime. Pt can only use brand name drug   Yes [provider]  sertraline (ZOLOFT) 100 MG tablet Take 100 mg by mouth daily.    Yes [provider]    Family History No family history on file.  Social History Social History   Tobacco Use  . Smoking status: Never Smoker  . Smokeless tobacco: Never Used  Substance Use Topics  . Alcohol use: No  . Drug use: No     Allergies   Patient has no known allergies.   Review  of Systems Review of Systems  Gastrointestinal: Positive for abdominal pain.  All other systems reviewed and are negative.    Physical Exam Updated Vital Signs BP (!) 141/85   Pulse 73   Temp 98.1 F (36.7 C) (Oral)   Resp 18   Ht 4\' 9"  (1.448 m)   Wt 45.8 kg (101 lb)   SpO2 95%   BMI 21.86 kg/m   Physical Exam  Constitutional:  Chronically ill, uncomfortable   HENT:  Head: Normocephalic.  Mouth/Throat: Oropharynx is clear and moist.  Eyes: Pupils are equal, round, and reactive to light. EOM are normal.  Cardiovascular: Normal rate, regular rhythm and normal heart sounds.  Pulmonary/Chest: Effort normal and breath sounds normal.  Abdominal:  Increased bowel sounds. + distended. Tenderness R side of abdomen. R inguinal hernia reducible and nontender   Neurological: She is alert.  Skin: Skin is warm. Capillary refill takes less than 2 seconds.  Psychiatric: She has a normal mood and affect.  Nursing note and vitals reviewed.    ED Treatments / Results  Labs (all labs ordered are listed, but only abnormal results are displayed) Labs Reviewed  CBC WITH DIFFERENTIAL/PLATELET - Abnormal; Notable for the following components:      Result Value   RBC 3.21 (*)    Hemoglobin 8.3 (*)    HCT 27.7 (*)    MCH 25.9 (*)    All other components within normal limits  COMPREHENSIVE METABOLIC PANEL - Abnormal; Notable for the following components:   Glucose, Bld 133 (*)    BUN 35 (*)    Creatinine, Ser 1.22 (*)    ALT 11 (*)    GFR calc non Af Amer 38 (*)    GFR calc Af Amer 44 (*)    All other components within normal limits  LIPASE, BLOOD  URINALYSIS, ROUTINE W REFLEX MICROSCOPIC  I-STAT CG4 LACTIC ACID, ED  I-STAT TROPONIN, ED    EKG None  Radiology Ct Abdomen Pelvis W Contrast  Result Date: 09/21/2017 CLINICAL DATA:  Abdominal pain with nausea EXAM: CT ABDOMEN AND PELVIS WITH CONTRAST TECHNIQUE: Multidetector CT imaging of the abdomen and pelvis was performed  using the standard protocol following bolus administration of intravenous contrast. CONTRAST:  31mL OMNIPAQUE IOHEXOL 300 MG/ML  SOLN COMPARISON:  CT abdomen pelvis 03/28/2015 FINDINGS: LOWER CHEST: Trace right pleural effusion with bibasilar atelectasis. HEPATOBILIARY: Normal hepatic contours and density. No intra- or extrahepatic biliary dilatation. Normal gallbladder. There is a small amount of perihepatic free fluid. PANCREAS: Normal parenchymal contours without ductal dilatation. No peripancreatic fluid collection. SPLEEN: Normal. ADRENALS/URINARY TRACT: --Adrenal glands: Normal. --Right kidney/ureter: No hydronephrosis, nephroureterolithiasis, perinephric stranding or solid renal mass. --Left kidney/ureter: No hydronephrosis, nephroureterolithiasis, perinephric stranding or solid renal mass. --Urinary bladder: Normal  for degree of distention STOMACH/BOWEL: --Stomach/Duodenum: No hiatal hernia or other gastric abnormality. Normal duodenal course. --Small bowel: No dilatation or inflammation. --Colon: At the splenic flexure, there is a high-grade colonic stenosis with hyperenhancement of the wall, concerning for a colonic mass. The entirety of the colon proximal to this point is markedly dilated and filled with fluid. The cecum is massively distended. There is sigmoid diverticulosis without acute inflammation. The distal colon is decompressed. --Appendix: Not clearly visualized. VASCULAR/LYMPHATIC: Atherosclerotic calcification is present within the non-aneurysmal abdominal aorta, without hemodynamically significant stenosis. The portal vein, splenic vein, superior mesenteric vein and IVC are patent. No abdominal or pelvic lymphadenopathy. REPRODUCTIVE: Normal uterus and ovaries. MUSCULOSKELETAL. No bony spinal canal stenosis or focal osseous abnormality. OTHER: There is fluid tracking into a right inguinal hernia, unchanged. IMPRESSION: 1. High-grade colonic stricture at the splenic flexure with severe  dilatation of the cecum, ascending colon and transverse colon. This is highly concerning for a colonic carcinoma at this location. 2. Small amount of free fluid in the abdomen. 3. Trace right pleural effusion. Electronically Signed   By: Ulyses Jarred M.D.   On: 10/12/2017 17:42    Procedures Procedures (including critical care time)  Medications Ordered in ED Medications  ondansetron (ZOFRAN) injection 4 mg (4 mg Intravenous Given 10/10/2017 1446)  acetaminophen (TYLENOL) tablet 650 mg (650 mg Oral Given 09/28/2017 1449)  sodium chloride 0.9 % bolus 500 mL (0 mLs Intravenous Stopped 10/06/2017 1604)  iohexol (OMNIPAQUE) 300 MG/ML solution 80 mL (80 mLs Intravenous Contrast Given 09/16/2017 1712)     Initial Impression / Assessment and Plan / ED Course  I have reviewed the triage vital signs and the nursing notes.  Pertinent labs & imaging results that were available during my care of the patient were reviewed by me and considered in my medical decision making (see chart for details).     [name] is a 84 y.o. female here with abdominal pain and distention. Consider SBO. Has inguinal hernia that is easily reducible so less likely to have incarcerated hernia. Will get labs, UA, CT ab/pel.   6:15 PM  CT ab/pel showed splenic stricture with dilated colon. I called Dr. Reece Agar from surgery. He will see patient and call GI tomorrow. Recommend hospitalist admission. Update patient and family.   Final Clinical Impressions(s) / ED Diagnoses   Final diagnoses:  None    ED Discharge Orders    None       Drenda Freeze, MD 09/28/2017 [id number]

## 2017-10-04 DIAGNOSIS — Z515 Encounter for palliative care: Secondary | ICD-10-CM

## 2017-10-04 DIAGNOSIS — R1084 Generalized abdominal pain: Secondary | ICD-10-CM

## 2017-10-04 DIAGNOSIS — N183 Chronic kidney disease, stage 3 (moderate): Secondary | ICD-10-CM

## 2017-10-04 DIAGNOSIS — I10 Essential (primary) hypertension: Secondary | ICD-10-CM

## 2017-10-04 DIAGNOSIS — Z66 Do not resuscitate: Secondary | ICD-10-CM

## 2017-10-04 DIAGNOSIS — K56609 Unspecified intestinal obstruction, unspecified as to partial versus complete obstruction: Secondary | ICD-10-CM

## 2017-10-04 MED ORDER — LORAZEPAM 2 MG/ML IJ SOLN
1.0000 mg | INTRAMUSCULAR | Status: DC | PRN
  Administered 2017-10-04 – 2017-10-07 (×6): 1 mg via INTRAVENOUS
  Filled 2017-10-04 (×6): qty 1

## 2017-10-04 MED ORDER — HYDROMORPHONE HCL 2 MG/ML IJ SOLN
0.3000 mg | INTRAMUSCULAR | Status: DC
  Administered 2017-10-04 – 2017-10-07 (×18): 0.3 mg via INTRAVENOUS
  Filled 2017-10-04 (×17): qty 1

## 2017-10-04 NOTE — Progress Notes (Addendum)
PROGRESS NOTE    [name]  MWN:[id number] DOB: 10-16-1923 DOA: 09/23/2017 PCP: Lavone Orn, MD    Brief Narrative:  [name] is a 84 y.o. female with medical history significant of hypertension, hyperlipidemia, stroke of the left basal ganglia, bipolar depression, who presents the emergency department today with complaints of abdominal pain which is been subacute for the past 2 weeks. CT scan which showed massive colonic distention with extra at the splenic flexure with severe dilatation of the cecum a sending colon and transverse colon. Surgery consulted, NG tube placed.     Assessment & Plan:   Principal Problem:   Large bowel obstruction (HCC) Active Problems:   Hypertension   Hyperlipidemia   Chronic kidney disease   Hx of arterial ischemic stroke   Anxiety state   Depression   Bipolar disorder (HCC)   Iron deficiency anemia due to chronic blood loss   Large bowel obstruction:  Secondary to possibly a tumor. Patient does not want surgery or colonoscopy. NG tube placed. Palliative care consulted for end of life care and symptom management.    Hypertension:  Well controlled. Prn IV hydralazine.    Stage 2 CKD: - Creatinine at baseline.   Anxiety/ Depression/ Bipolar disorder:  All oral meds on hold.  Prn benzodiazepine.    DVT prophylaxis: scd's Code Status: DNR Family Communication: none at bedside.  Disposition Plan:    Consultants:  Surgery Palliative care.   Procedures: none.    Antimicrobials: none.    Subjective: Comfortable.   Objective: Vitals:   10/04/17 0815 10/04/17 0835 10/04/17 0900 10/04/17 0951  BP:   (!) 136/108 (!) 105/56  Pulse: 74  78 77  Resp:    12  Temp:  98.1 F (36.7 C)  98.3 F (36.8 C)  TempSrc:  Oral  Oral  SpO2: 94%  93% 92%  Weight:      Height:        Intake/Output Summary (Last 24 hours) at 10/04/2017 1430 Last data filed at 09/13/2017 1604 Gross per 24 hour  Intake 500 ml  Output -  Net 500 ml     Filed Weights   10/11/2017 1349  Weight: 45.8 kg (101 lb)    Examination:  General exam: ill appearing lady with NG tube  Respiratory system: diminished at bases.  Cardiovascular system: S1 & S2 heard, RRR. No JVD, murmurs, No pedal edema. Gastrointestinal system: Abdomen is soft, severely distended , tender  Central nervous system: sleeping comfortably, woke up to verbal cues. Able to move all extremities.  Extremities: no pedal edema.  Skin: No rashes, lesions or ulcers Psychiatry: sleeping comfortably.     Data Reviewed: I have personally reviewed following labs and imaging studies  CBC: Recent Labs  Lab 09/29/2017 1403  WBC 7.0  NEUTROABS 5.5  HGB 8.3*  HCT 27.7*  MCV 86.3  PLT 403   Basic Metabolic Panel: Recent Labs  Lab 10/02/2017 1403  NA 138  K 4.0  CL 108  CO2 23  GLUCOSE 133*  BUN 35*  CREATININE 1.22*  CALCIUM 9.7   GFR: Estimated Creatinine Clearance: 18.3 mL/min (A) (by C-G formula based on SCr of 1.22 mg/dL (H)). Liver Function Tests: Recent Labs  Lab 10/02/2017 1403  AST 22  ALT 11*  ALKPHOS 83  BILITOT 0.4  PROT 7.4  ALBUMIN 3.7   Recent Labs  Lab 09/29/2017 1403  LIPASE 45   No results for input(s): AMMONIA in the last 168 hours. Coagulation Profile:  No results for input(s): INR, PROTIME in the last 168 hours. Cardiac Enzymes: No results for input(s): CKTOTAL, CKMB, CKMBINDEX, TROPONINI in the last 168 hours. BNP (last 3 results) No results for input(s): PROBNP in the last 8760 hours. HbA1C: No results for input(s): HGBA1C in the last 72 hours. CBG: No results for input(s): GLUCAP in the last 168 hours. Lipid Profile: No results for input(s): CHOL, HDL, LDLCALC, TRIG, CHOLHDL, LDLDIRECT in the last 72 hours. Thyroid Function Tests: No results for input(s): TSH, T4TOTAL, FREET4, T3FREE, THYROIDAB in the last 72 hours. Anemia Panel: No results for input(s): VITAMINB12, FOLATE, FERRITIN, TIBC, IRON, RETICCTPCT in the last 72  hours. Sepsis Labs: Recent Labs  Lab 09/14/2017 1443  LATICACIDVEN 1.21    No results found for this or any previous visit (from the past 240 hour(s)).       Radiology Studies: Ct Abdomen Pelvis W Contrast  Result Date: 10/04/2017 CLINICAL DATA:  Abdominal pain with nausea EXAM: CT ABDOMEN AND PELVIS WITH CONTRAST TECHNIQUE: Multidetector CT imaging of the abdomen and pelvis was performed using the standard protocol following bolus administration of intravenous contrast. CONTRAST:  75mL OMNIPAQUE IOHEXOL 300 MG/ML  SOLN COMPARISON:  CT abdomen pelvis 03/28/2015 FINDINGS: LOWER CHEST: Trace right pleural effusion with bibasilar atelectasis. HEPATOBILIARY: Normal hepatic contours and density. No intra- or extrahepatic biliary dilatation. Normal gallbladder. There is a small amount of perihepatic free fluid. PANCREAS: Normal parenchymal contours without ductal dilatation. No peripancreatic fluid collection. SPLEEN: Normal. ADRENALS/URINARY TRACT: --Adrenal glands: Normal. --Right kidney/ureter: No hydronephrosis, nephroureterolithiasis, perinephric stranding or solid renal mass. --Left kidney/ureter: No hydronephrosis, nephroureterolithiasis, perinephric stranding or solid renal mass. --Urinary bladder: Normal for degree of distention STOMACH/BOWEL: --Stomach/Duodenum: No hiatal hernia or other gastric abnormality. Normal duodenal course. --Small bowel: No dilatation or inflammation. --Colon: At the splenic flexure, there is a high-grade colonic stenosis with hyperenhancement of the wall, concerning for a colonic mass. The entirety of the colon proximal to this point is markedly dilated and filled with fluid. The cecum is massively distended. There is sigmoid diverticulosis without acute inflammation. The distal colon is decompressed. --Appendix: Not clearly visualized. VASCULAR/LYMPHATIC: Atherosclerotic calcification is present within the non-aneurysmal abdominal aorta, without hemodynamically  significant stenosis. The portal vein, splenic vein, superior mesenteric vein and IVC are patent. No abdominal or pelvic lymphadenopathy. REPRODUCTIVE: Normal uterus and ovaries. MUSCULOSKELETAL. No bony spinal canal stenosis or focal osseous abnormality. OTHER: There is fluid tracking into a right inguinal hernia, unchanged. IMPRESSION: 1. High-grade colonic stricture at the splenic flexure with severe dilatation of the cecum, ascending colon and transverse colon. This is highly concerning for a colonic carcinoma at this location. 2. Small amount of free fluid in the abdomen. 3. Trace right pleural effusion. Electronically Signed   By: Ulyses Jarred M.D.   On: 10/08/2017 17:42   Dg Chest Port 1 View  Result Date: 09/12/2017 CLINICAL DATA:  NG tube EXAM: PORTABLE CHEST 1 VIEW COMPARISON:  03/11/2017 FINDINGS: Esophageal tube is folded back upon itself in the region of the GE junction. Small pleural effusions. Bibasilar airspace disease. Aortic atherosclerosis. IMPRESSION: 1. Esophageal tube tip is folded back upon itself with the tip directed cephalad, in the region of the GE junction 2. Small pleural effusions with bibasilar airspace disease Electronically Signed   By: Donavan Foil M.D.   On: 10/10/2017 22:19        Scheduled Meds: .  HYDROmorphone (DILAUDID) injection  0.3 mg Intravenous Q4H  . sodium chloride flush  3 mL  Intravenous Q12H   Continuous Infusions: . sodium chloride       LOS: 1 day    Time spent: 30 minutes.     Hosie Poisson, MD [facility] Pager [phone]  If 7PM-7AM, please contact night-coverage [email or web address] Password [email or web address] 10/04/2017, 2:30 PM

## 2017-10-04 NOTE — ED Notes (Signed)
Attempted report 

## 2017-10-04 NOTE — Progress Notes (Signed)
[facility] Liaison RN visit  Received request from [place], [place] for family interest in [facility]. Chart reviewed and spoke with family to acknowledge referral. Unfortunately [facility] is not able to offer a room today. Family and CSW are aware [facility] liaison will follow up with CSW and family tomorrow if room becomes available. Please do not hesitate to call with questions.  Thank you,  Farrel Gordon, RN, [facility] Liaison [place] are on AMION.

## 2017-10-04 NOTE — Consult Note (Signed)
            [facility] CM Primary Care Navigator  10/04/2017  TYRONZA HAPPE [birth year] [id number]   Attempt to see patient to identify possible discharge needs but noted on MD note that Palliative care was consulted for end of life care and symptom management. Palliative care consult made and family have made decision to focus on comfort, quality and dignity, no further work-up or surgery, and hopeful for residential hospice for end of life care.     Noted no further health management needs at this point.   For additional questions please contact:  Edwena Felty A. Jacci Ruberg, BSN, RN-BC [facility] PRIMARY CARE Navigator Cell: [phone]

## 2017-10-04 NOTE — Consult Note (Signed)
Consultation Note Date: 10/04/2017   Patient Name: [name]  DOB: 1924/02/22  MRN: [id number]  Age / Sex: 84 y.o., female  PCP: Lavone Orn, MD Referring Physician: Hosie Poisson, MD  Reason for Consultation: Establishing goals of care, Pain control and Psychosocial/spiritual support  HPI/Patient Profile: 84 y.o. female   admitted on 09/14/2017 with past  medical history significant of hypertension, hyperlipidemia, stroke of the left basal ganglia, bipolar depression, who presents the emergency department  with complaints of worsening  abdominal pain.  CT scan 09/14/2017  IMPRESSION: 1. High-grade colonic stricture at the splenic flexure with severe dilatation of the cecum, ascending colon and transverse colon. This is highly concerning for a colonic carcinoma at this location. 2. Small amount of free fluid in the abdomen. 3. Trace right pleural effusion.  Family have made decision to focus on comfort, quality and dignity, no further work-up or surgery.   Family face anticipatory care needs.    Clinical Assessment and Goals of Care:  This NP Wadie Lessen reviewed medical records, received report from team, assessed the patient and then meet at the patient's bedside along with son and daughter-in-law, and daughter and son-in-law to discuss diagnosis, prognosis, GOC, EOL wishes disposition and options.  Concept of Hospice and Palliative Care were discussed  A detailed discussion was had today regarding advanced directives.  Concepts specific to code status, artifical feeding and hydration, continued IV antibiotics and rehospitalization was had.  The difference between a aggressive medical intervention path  and a palliative comfort care path for this patient at this time was had.  Values and goals of care important to patient and family were attempted to be elicited.  Natural trajectory and expectations  at EOL were discussed.  Questions and concerns addressed.   Family encouraged to call with questions or concerns.    PMT will continue to support holistically.   HCPOA    SUMMARY OF RECOMMENDATIONS    Code Status/Advance Care Planning:  DNR    Symptom Management:   Pain: Dilaudid 0.3 mg IV every 4 hrs and Dilaudid 0.5 mg IV every 2 hours as needed for breakthrough.  Continue with gastric tube for comfort at low suction  Agitation: Ativan 1 mg every 4 hours as needed  Terminal secretion: Robinul   Palliative Prophylaxis:   Aspiration, Frequent Pain Assessment and Oral Care  Additional Recommendations (Limitations, Scope, Preferences):  Full Comfort Care  Psycho-social/Spiritual:   Desire for further Chaplaincy support--yes  Additional Recommendations: Education on Hospice  Prognosis:   Less than 2 weeks   Discharge Planning: Hopeful for residential hospice for EOL care, will write for choice     Primary Diagnoses: Present on Admission: . Large bowel obstruction ([place]) . Hypertension . Hyperlipidemia . Chronic kidney disease . Anxiety state . Bipolar disorder ([place]) . Depression   I have reviewed the medical record, interviewed the patient and family, and examined the patient. The following aspects are pertinent.  Past Medical History:  Diagnosis Date  . Bipolar disorder ([facility])   .  C. difficile colitis   . Coronary artery disease   . Depression   . Dysphagia   . GERD (gastroesophageal reflux disease)   . Hard of hearing   . Hypercholesteremia   . Hypertension   . Renal disorder   . Skin cancer   . Stroke [facility])    Social History   Socioeconomic History  . Marital status: Single    Spouse name: Not on file  . Number of children: Not on file  . Years of education: Not on file  . Highest education level: Not on file  Occupational History  . Not on file  Social Needs  . Financial resource strain: Not on file  . Food insecurity:    Worry:  Not on file    Inability: Not on file  . Transportation needs:    Medical: Not on file    Non-medical: Not on file  Tobacco Use  . Smoking status: Never Smoker  . Smokeless tobacco: Never Used  Substance and Sexual Activity  . Alcohol use: No  . Drug use: No  . Sexual activity: Not on file  Lifestyle  . Physical activity:    Days per week: Not on file    Minutes per session: Not on file  . Stress: Not on file  Relationships  . Social connections:    Talks on phone: Not on file    Gets together: Not on file    Attends religious service: Not on file    Active member of club or organization: Not on file    Attends meetings of clubs or organizations: Not on file    Relationship status: Not on file  Other Topics Concern  . Not on file  Social History Narrative  . Not on file   No family history on file. Scheduled Meds: .  HYDROmorphone (DILAUDID) injection  0.3 mg Intravenous Q4H  . sodium chloride flush  3 mL Intravenous Q12H   Continuous Infusions: . sodium chloride     PRN Meds:.sodium chloride, acetaminophen **OR** acetaminophen, antiseptic oral rinse, diphenhydrAMINE, glycopyrrolate **OR** glycopyrrolate, haloperidol **OR** haloperidol **OR** haloperidol lactate, HYDROmorphone (DILAUDID) injection, HYDROmorphone (DILAUDID) injection, LORazepam, magic mouthwash w/lidocaine, octreotide, ondansetron **OR** ondansetron (ZOFRAN) IV, polyvinyl alcohol, sodium chloride flush Medications Prior to Admission:  Prior to Admission medications   Not on File   No Known Allergies Review of Systems  Gastrointestinal: Positive for abdominal pain.    Physical Exam  Constitutional: She appears lethargic.  Frail, elderly  Cardiovascular: Normal rate and regular rhythm.  Abdominal: She exhibits distension. Bowel sounds are decreased. There is generalized tenderness.  Neurological: She appears lethargic.  Skin: Skin is warm and dry.    Vital Signs: BP (!) 136/108   Pulse 78    Temp 98.1 F (36.7 C) (Oral)   Resp 18   Ht 4\' 9"  (1.448 m)   Wt 45.8 kg (101 lb)   SpO2 93%   BMI 21.86 kg/m      Pain Score: Asleep   SpO2: SpO2: 93 % O2 Device:SpO2: 93 % O2 Flow Rate: .   IO: Intake/output summary:   Intake/Output Summary (Last 24 hours) at 10/04/2017 7425 Last data filed at 09/27/2017 1604 Gross per 24 hour  Intake 500 ml  Output -  Net 500 ml    LBM:   Baseline Weight: Weight: 45.8 kg (101 lb) Most recent weight: Weight: 45.8 kg (101 lb)     Palliative Assessment/Data:   Discussed with Dr Karleen Hampshire  Time In: 1345  Time Out: 1500 Time Total: 75 minutes Greater than 50%  of this time was spent counseling and coordinating care related to the above assessment and plan.  Signed by: Wadie Lessen, NP   Please contact Palliative Medicine Team phone at [phone] for questions and concerns.  For individual provider: See Shea Evans

## 2017-10-04 NOTE — Progress Notes (Signed)
    CC: Abdominal distention with intermittent pain for 2 weeks  Subjective: NG placed last PM but not much coming out and it has moved.  I ask the nurses to work with it.  Family at bedside and want to keep her comfortable without nausea.  She is still very distended and tender.    Objective: Vital signs in last 24 hours: Temp:  [97.9 F (36.6 C)-98.6 F (37 C)] 98.6 F (37 C) (05/23 0106) Pulse Rate:  [57-84] 74 (05/23 0700) Resp:  [15-22] 18 (05/23 0106) BP: (104-159)/(50-85) 118/65 (05/23 0700) SpO2:  [90 %-100 %] 94 % (05/23 0700) Weight:  [45.8 kg (101 lb)] 45.8 kg (101 lb) (05/22 1349)  500 IV nothing else recorded Afebrile vital signs are stable. Admission labs are stable  Intake/Output from previous day: 05/22 0701 - 05/23 0700 In: 500 [IV Piggyback:500] Out: -  Intake/Output this shift: No intake/output data recorded.  General appearance: acutely ill and uncomfortable, pain with light palpation of the abdomen.   GI: severely distended and tender  Lab Results:  Recent Labs    09/15/2017 1403  WBC 7.0  HGB 8.3*  HCT 27.7*  PLT 186    BMET Recent Labs    10/04/2017 1403  NA 138  K 4.0  CL 108  CO2 23  GLUCOSE 133*  BUN 35*  CREATININE 1.22*  CALCIUM 9.7   PT/INR No results for input(s): LABPROT, INR in the last 72 hours.  Recent Labs  Lab 09/16/2017 1403  AST 22  ALT 11*  ALKPHOS 4  BILITOT 0.4  PROT 7.4  ALBUMIN 3.7     Lipase     Component Value Date/Time   LIPASE 45 09/17/2017 1403     Medications: . sodium chloride flush  3 mL Intravenous Q12H    . sodium chloride      Assessment/Plan Chronic kidney disease Hypertension History of CVA History of depression and bipolar disorder Anemia  High-grade right colon stricture severe dilatation of the cecum, ascending, and transverse colon  - Probable colon carcinoma  -Surgical treatment would involve resection with colostomy, possible ileostomy and prolonged              hospitalization  -Options reviewed with family with a move towards palliative management.  DNR Palliative care evaluation pending  FEN: Clear liquids ID: None DVT: None currently   Plan:  Family has opted for Palliative care, with comfort being the most important thing.  We will be available if needed.  Please call.      LOS: 1 day    Leovardo Thoman 10/04/2017 [phone]

## 2017-10-04 NOTE — Social Work (Signed)
CSW acknowledging consult for pt family interest in [facility]. CSW has made referral to liaison Bevely Palmer with [place]. Await information regarding bed availability.   Alexander Mt, [place] Work [phone]

## 2017-10-04 NOTE — Progress Notes (Signed)
Visited with patient per [facility] to provide support. Son and daughter along with other family at bedside. Provided prayer ,emotional and spiritual support. Patient in good spirit and family very supportive.  Will follow as needed.    10/04/17 1339  Clinical Encounter Type  Visited With Patient and family together;Health care provider  Visit Type Initial;Spiritual support  Referral From Nurse  Spiritual Encounters  Spiritual Needs Prayer;Emotional  Stress Factors  Family Stress Factors None identified  Cristopher Peru, [facility], Pager [phone]

## 2017-10-05 NOTE — Social Work (Signed)
CSW has f/u with [facility], no beds available today. If this changes [facility] liaison Bevely Palmer will follow up with this Probation officer. CSW continues to follow.   Alexander Mt, [place] Work [phone]

## 2017-10-05 NOTE — Progress Notes (Signed)
Patient ID: [name], female   DOB: 09/09/1923, 84 y.o.   MRN: [id number]  This NP visited patient at the bedside as a follow up to  yesterday's [place].  Daughter in Armed forces technical officer at bedside, she is a Therapist, sports and caring so beautifully for her mother in law at this EOL time.  Focus of care is comfort and dignity Patient is minimally responsive and appears comfortable on current scheduled medications.  Minimal urine OP and small amount of drainage via NG to low suction.  Continued conversation regarding natural trajectory and expectations at EOL.  Questions and concerns addressed     Prognosis is likely days.  Hopeful for residential hospice for EOL care.   Family encouraged to call team phone for any week-end needs.    Total time spent on the unit was 35 minutes  Wadie Lessen NP  Palliative Medicine Team Team Phone # [phone] Pager [phone]

## 2017-10-05 NOTE — Progress Notes (Signed)
[facility] Liaison RN visit  Received request from [place], [place] for family interest in [facility]. Chart reviewed and spoke with son, Elta Guadeloupe to update on status of availability.  Unfortunately [facility] is not able to offer a room today. Family and CSW are aware [facility] liaison will follow up with CSW and family tomorrow if room becomes available. Please do not hesitate to call with questions.  Thank you,  Farrel Gordon, RN, [facility] Liaison [place] are on AMION.

## 2017-10-05 NOTE — Progress Notes (Signed)
PROGRESS NOTE    TANDY GRAWE  UYQ:[id number] DOB: December 31, 1923 DOA: 09/26/2017 PCP: Lavone Orn, MD    Brief Narrative:  [name] is a 84 y.o. female with medical history significant of hypertension, hyperlipidemia, stroke of the left basal ganglia, bipolar depression, who presents the emergency department today with complaints of abdominal pain which is been subacute for the past 2 weeks. CT scan which showed massive colonic distention with extra at the splenic flexure with severe dilatation of the cecum a sending colon and transverse colon. Surgery consulted, NG tube placed.     Assessment & Plan:   Principal Problem:   Large bowel obstruction (HCC) Active Problems:   Hypertension   Hyperlipidemia   Chronic kidney disease   Hx of arterial ischemic stroke   Anxiety state   Depression   Bipolar disorder (HCC)   Iron deficiency anemia due to chronic blood loss   Generalized abdominal pain   Palliative care by specialist   DNR (do not resuscitate)   Large bowel obstruction:  Secondary to possibly a tumor. Patient does not want surgery or colonoscopy. NG tube placed. Palliative care consulted for end of life care and symptom management.  Plan for residential hospice when bed available. Currently pt has NG tube and is connected to low intermittent suction.    Hypertension:   Well controlled. Prn IV hydralazine.    Stage 2 CKD: - Creatinine at baseline.   Anxiety/ Depression/ Bipolar disorder:  All oral meds on hold.  Prn benzodiazepine.    DVT prophylaxis: scd's Code Status: DNR Family Communication: discussed with multiple family members at bedside.  Disposition Plan: residential hospice when bed available, suspect she will have a hospital death.    Consultants:  Surgery Palliative care.   Procedures: none.    Antimicrobials: none.    Subjective: Sleeping comfortably.   Objective: Vitals:   10/04/17 0835 10/04/17 0900 10/04/17 0951 10/05/17 0529    BP:  (!) 136/108 (!) 105/56 (!) 143/64  Pulse:  78 77 (!) 101  Resp:   12 12  Temp: 98.1 F (36.7 C)  98.3 F (36.8 C) 98.7 F (37.1 C)  TempSrc: Oral  Oral Oral  SpO2:  93% 92% (!) 82%  Weight:      Height:   4\' 10"  (1.473 m)     Intake/Output Summary (Last 24 hours) at 10/05/2017 1731 Last data filed at 10/05/2017 0945 Gross per 24 hour  Intake 0 ml  Output 50 ml  Net -50 ml   Filed Weights   09/18/2017 1349  Weight: 45.8 kg (101 lb)    Examination:  General exam: ill appearing lady with NG tube  Respiratory system: diminished at bases.  Cardiovascular system: S1 & S2 heard, RRR.  Gastrointestinal system: Abdomen is soft, distended  Central nervous system: sleeping comfortably,  Extremities: no pedal edema.  Skin: No rashes, lesions or ulcers Psychiatry: sleeping comfortably.     Data Reviewed: I have personally reviewed following labs and imaging studies  CBC: Recent Labs  Lab 10/05/2017 1403  WBC 7.0  NEUTROABS 5.5  HGB 8.3*  HCT 27.7*  MCV 86.3  PLT 638   Basic Metabolic Panel: Recent Labs  Lab 10/10/2017 1403  NA 138  K 4.0  CL 108  CO2 23  GLUCOSE 133*  BUN 35*  CREATININE 1.22*  CALCIUM 9.7   GFR: Estimated Creatinine Clearance: 19.4 mL/min (A) (by C-G formula based on SCr of 1.22 mg/dL (H)). Liver Function Tests: Recent Labs  Lab 10/01/2017 1403  AST 22  ALT 11*  ALKPHOS 83  BILITOT 0.4  PROT 7.4  ALBUMIN 3.7   Recent Labs  Lab 09/12/2017 1403  LIPASE 45   No results for input(s): AMMONIA in the last 168 hours. Coagulation Profile: No results for input(s): INR, PROTIME in the last 168 hours. Cardiac Enzymes: No results for input(s): CKTOTAL, CKMB, CKMBINDEX, TROPONINI in the last 168 hours. BNP (last 3 results) No results for input(s): PROBNP in the last 8760 hours. HbA1C: No results for input(s): HGBA1C in the last 72 hours. CBG: No results for input(s): GLUCAP in the last 168 hours. Lipid Profile: No results for input(s):  CHOL, HDL, LDLCALC, TRIG, CHOLHDL, LDLDIRECT in the last 72 hours. Thyroid Function Tests: No results for input(s): TSH, T4TOTAL, FREET4, T3FREE, THYROIDAB in the last 72 hours. Anemia Panel: No results for input(s): VITAMINB12, FOLATE, FERRITIN, TIBC, IRON, RETICCTPCT in the last 72 hours. Sepsis Labs: Recent Labs  Lab 09/19/2017 1443  LATICACIDVEN 1.21    No results found for this or any previous visit (from the past 240 hour(s)).       Radiology Studies: Dg Chest Port 1 View  Result Date: 10/04/2017 CLINICAL DATA:  NG tube EXAM: PORTABLE CHEST 1 VIEW COMPARISON:  03/11/2017 FINDINGS: Esophageal tube is folded back upon itself in the region of the GE junction. Small pleural effusions. Bibasilar airspace disease. Aortic atherosclerosis. IMPRESSION: 1. Esophageal tube tip is folded back upon itself with the tip directed cephalad, in the region of the GE junction 2. Small pleural effusions with bibasilar airspace disease Electronically Signed   By: Donavan Foil M.D.   On: 09/20/2017 22:19        Scheduled Meds: .  HYDROmorphone (DILAUDID) injection  0.3 mg Intravenous Q4H  . sodium chloride flush  3 mL Intravenous Q12H   Continuous Infusions: . sodium chloride       LOS: 2 days    Time spent: 30 minutes.     Hosie Poisson, MD [facility] Pager [phone]  If 7PM-7AM, please contact night-coverage [email or web address] Password [facility] 10/05/2017, 5:31 PM

## 2017-10-06 MED ORDER — LORAZEPAM 2 MG/ML IJ SOLN
1.0000 mg | Freq: Once | INTRAMUSCULAR | Status: AC
  Administered 2017-10-06: 1 mg via INTRAVENOUS
  Filled 2017-10-06: qty 1

## 2017-10-06 MED ORDER — GLYCOPYRROLATE 0.2 MG/ML IJ SOLN
0.2000 mg | Freq: Once | INTRAMUSCULAR | Status: AC
Start: 2017-10-06 — End: 2017-10-06
  Administered 2017-10-06: 0.2 mg via INTRAVENOUS
  Filled 2017-10-06: qty 1

## 2017-10-06 NOTE — Progress Notes (Signed)
PROGRESS NOTE    [name]  AJO:[id number] DOB: 1923/12/23 DOA: 09/19/2017 PCP: Lavone Orn, MD    Brief Narrative:  FARRAN AMSDEN is a 84 y.o. female with medical history significant of hypertension, hyperlipidemia, stroke of the left basal ganglia, bipolar depression, who presents the emergency department today with complaints of abdominal pain which is been subacute for the past 2 weeks. CT scan which showed massive colonic distention with extra at the splenic flexure with severe dilatation of the cecum a sending colon and transverse colon. Surgery consulted, NG tube placed. PATIENT transitioned to comfort care. NT tube removed.     Assessment & Plan:   Principal Problem:   Large bowel obstruction (HCC) Active Problems:   Hypertension   Hyperlipidemia   Chronic kidney disease   Hx of arterial ischemic stroke   Anxiety state   Depression   Bipolar disorder (HCC)   Iron deficiency anemia due to chronic blood loss   Generalized abdominal pain   Palliative care by specialist   DNR (do not resuscitate)   Large bowel obstruction:  Secondary to possibly a tumor. Patient does not want surgery or colonoscopy. NG tube placed. Palliative care consulted for end of life care and symptom management.  Plan for residential hospice when bed available. NT tube removed today.    Hypertension:   Well controlled. Prn IV hydralazine.    Stage 2 CKD: - Creatinine at baseline.   Anxiety/ Depression/ Bipolar disorder:  All oral meds on hold.  Prn benzodiazepine.    DVT prophylaxis: scd's Code Status: DNR Family Communication: discussed with multiple family members at bedside.  Disposition Plan: residential hospice when bed available, suspect she will have a hospital death.    Consultants:  Surgery Palliative care.   Procedures: none.    Antimicrobials: none.    Subjective: Sleeping comfortably.   Objective: Vitals:   10/04/17 0900 10/04/17 0951 10/05/17 0529  10/06/17 0418  BP: (!) 136/108 (!) 105/56 (!) 143/64 (!) 103/52  Pulse: 78 77 (!) 101 79  Resp:  12 12 15   Temp:  98.3 F (36.8 C) 98.7 F (37.1 C) 98.3 F (36.8 C)  TempSrc:  Oral Oral Oral  SpO2: 93% 92% (!) 82%   Weight:      Height:  4\' 10"  (1.473 m)      Intake/Output Summary (Last 24 hours) at 10/06/2017 1646 Last data filed at 10/06/2017 0840 Gross per 24 hour  Intake 0 ml  Output 0 ml  Net 0 ml   Filed Weights   10/04/2017 1349  Weight: 45.8 kg (101 lb)    Examination:no change in exam.   General exam: ill appearing lady with NG tube  Respiratory system: diminished at bases.  Cardiovascular system: S1 & S2 heard, RRR.  Gastrointestinal system: Abdomen is soft, distended  Central nervous system: sleeping comfortably,  Extremities: no pedal edema.  Skin: No rashes, lesions or ulcers Psychiatry: sleeping comfortably.     Data Reviewed: I have personally reviewed following labs and imaging studies  CBC: Recent Labs  Lab 10/04/2017 1403  WBC 7.0  NEUTROABS 5.5  HGB 8.3*  HCT 27.7*  MCV 86.3  PLT 947   Basic Metabolic Panel: Recent Labs  Lab 10/10/2017 1403  NA 138  K 4.0  CL 108  CO2 23  GLUCOSE 133*  BUN 35*  CREATININE 1.22*  CALCIUM 9.7   GFR: Estimated Creatinine Clearance: 19.4 mL/min (A) (by C-G formula based on SCr of 1.22 mg/dL (H)). Liver  Function Tests: Recent Labs  Lab 09/21/2017 1403  AST 22  ALT 11*  ALKPHOS 83  BILITOT 0.4  PROT 7.4  ALBUMIN 3.7   Recent Labs  Lab 09/16/2017 1403  LIPASE 45   No results for input(s): AMMONIA in the last 168 hours. Coagulation Profile: No results for input(s): INR, PROTIME in the last 168 hours. Cardiac Enzymes: No results for input(s): CKTOTAL, CKMB, CKMBINDEX, TROPONINI in the last 168 hours. BNP (last 3 results) No results for input(s): PROBNP in the last 8760 hours. HbA1C: No results for input(s): HGBA1C in the last 72 hours. CBG: No results for input(s): GLUCAP in the last 168  hours. Lipid Profile: No results for input(s): CHOL, HDL, LDLCALC, TRIG, CHOLHDL, LDLDIRECT in the last 72 hours. Thyroid Function Tests: No results for input(s): TSH, T4TOTAL, FREET4, T3FREE, THYROIDAB in the last 72 hours. Anemia Panel: No results for input(s): VITAMINB12, FOLATE, FERRITIN, TIBC, IRON, RETICCTPCT in the last 72 hours. Sepsis Labs: Recent Labs  Lab 10/06/2017 1443  LATICACIDVEN 1.21    No results found for this or any previous visit (from the past 240 hour(s)).       Radiology Studies: No results found.      Scheduled Meds: .  HYDROmorphone (DILAUDID) injection  0.3 mg Intravenous Q4H  . sodium chloride flush  3 mL Intravenous Q12H   Continuous Infusions: . sodium chloride       LOS: 3 days    Time spent: 30 minutes.     Hosie Poisson, MD [facility] Pager [phone]  If 7PM-7AM, please contact night-coverage [email or web address] Password [facility] 10/06/2017, 4:46 PM

## 2017-10-06 NOTE — Clinical Social Work Note (Signed)
[place] [place] does not have any beds today. They will notify CSW if anything becomes available this afternoon or tomorrow morning.  Dayton Scrape, [place]

## 2017-10-06 NOTE — Progress Notes (Signed)
Patient with secretions and loud rattle, dyspnea,and restlessness. Oral suction set up. 0.5mg  IV Dilaudid administered. Robinul and Ativan not due until 2130. Patient's family member, Beth, requesting patient have dose now and have those meds  more frequently. On call for [facility] paged.

## 2017-10-06 NOTE — Progress Notes (Signed)
Prn robinol administered with ativan for increased gurgling and secretions

## 2017-10-06 NOTE — Progress Notes (Signed)
Pt's family declined multiple position changes when offered, only prefer turning pt once a shift

## 2017-10-13 NOTE — Discharge Summary (Signed)
Death Summary  SHADOW STIGGERS BTD:[id number] DOB: 07-12-1923 DOA: 2017/10/21  PCP: Lavone Orn, MD  Admit date: October 21, 2017 Date of Death: 10-25-2017 Time of Death: 7 am today.  Notification: Lavone Orn, MD notified of death of 27-Oct-2017   History of present illness:  [name] a 84 y.o.femalewith medical history significant ofhypertension, hyperlipidemia, stroke of the left basal ganglia, bipolar depression, who presents the emergency department today with complaints of abdominal pain which is been subacute for the past 2 weeks. CT scan which showed massive colonic distention with extra at the splenic flexure with severe dilatation of the cecum a sending colon and transverse colon. Surgery consulted, NG tube placed.  palliative care consulted and she was transitioned to comfort care. NT tube removed.     Final Diagnoses:  Large bowel obstruction (HCC) sec to possibly a colon carcinoma.    Hypertension   Hyperlipidemia   Chronic kidney disease   Hx of arterial ischemic stroke   Anxiety state   Depression   Bipolar disorder (HCC)   Iron deficiency anemia due to chronic blood loss   Generalized abdominal pain   Palliative care by specialist   DNR (do not resuscitate)   Large bowel obstruction:  Secondary to possibly a tumor. Patient does not want surgery or colonoscopy. NG tube placed. Palliative care consulted for end of life care and symptom management. NT tube removed for comfort and EOL.  She passed away at 7 am today.     The results of significant diagnostics from this hospitalization (including imaging, microbiology, ancillary and laboratory) are listed below for reference.    Significant Diagnostic Studies: Ct Abdomen Pelvis W Contrast  Result Date: 10-21-17 CLINICAL DATA:  Abdominal pain with nausea EXAM: CT ABDOMEN AND PELVIS WITH CONTRAST TECHNIQUE: Multidetector CT imaging of the abdomen and pelvis was performed using the standard protocol following  bolus administration of intravenous contrast. CONTRAST:  82mL OMNIPAQUE IOHEXOL 300 MG/ML  SOLN COMPARISON:  CT abdomen pelvis 03/28/2015 FINDINGS: LOWER CHEST: Trace right pleural effusion with bibasilar atelectasis. HEPATOBILIARY: Normal hepatic contours and density. No intra- or extrahepatic biliary dilatation. Normal gallbladder. There is a small amount of perihepatic free fluid. PANCREAS: Normal parenchymal contours without ductal dilatation. No peripancreatic fluid collection. SPLEEN: Normal. ADRENALS/URINARY TRACT: --Adrenal glands: Normal. --Right kidney/ureter: No hydronephrosis, nephroureterolithiasis, perinephric stranding or solid renal mass. --Left kidney/ureter: No hydronephrosis, nephroureterolithiasis, perinephric stranding or solid renal mass. --Urinary bladder: Normal for degree of distention STOMACH/BOWEL: --Stomach/Duodenum: No hiatal hernia or other gastric abnormality. Normal duodenal course. --Small bowel: No dilatation or inflammation. --Colon: At the splenic flexure, there is a high-grade colonic stenosis with hyperenhancement of the wall, concerning for a colonic mass. The entirety of the colon proximal to this point is markedly dilated and filled with fluid. The cecum is massively distended. There is sigmoid diverticulosis without acute inflammation. The distal colon is decompressed. --Appendix: Not clearly visualized. VASCULAR/LYMPHATIC: Atherosclerotic calcification is present within the non-aneurysmal abdominal aorta, without hemodynamically significant stenosis. The portal vein, splenic vein, superior mesenteric vein and IVC are patent. No abdominal or pelvic lymphadenopathy. REPRODUCTIVE: Normal uterus and ovaries. MUSCULOSKELETAL. No bony spinal canal stenosis or focal osseous abnormality. OTHER: There is fluid tracking into a right inguinal hernia, unchanged. IMPRESSION: 1. High-grade colonic stricture at the splenic flexure with severe dilatation of the cecum, ascending colon and  transverse colon. This is highly concerning for a colonic carcinoma at this location. 2. Small amount of free fluid in the abdomen. 3. Trace right pleural effusion.  Electronically Signed   By: Ulyses Jarred M.D.   On: 09/28/2017 17:42   Dg Chest Port 1 View  Result Date: 09/18/2017 CLINICAL DATA:  NG tube EXAM: PORTABLE CHEST 1 VIEW COMPARISON:  03/11/2017 FINDINGS: Esophageal tube is folded back upon itself in the region of the GE junction. Small pleural effusions. Bibasilar airspace disease. Aortic atherosclerosis. IMPRESSION: 1. Esophageal tube tip is folded back upon itself with the tip directed cephalad, in the region of the GE junction 2. Small pleural effusions with bibasilar airspace disease Electronically Signed   By: Donavan Foil M.D.   On: 09/13/2017 22:19    Microbiology: No results found for this or any previous visit (from the past 240 hour(s)).   Labs: Basic Metabolic Panel: Recent Labs  Lab 09/19/2017 1403  NA 138  K 4.0  CL 108  CO2 23  GLUCOSE 133*  BUN 35*  CREATININE 1.22*  CALCIUM 9.7   Liver Function Tests: Recent Labs  Lab 09/23/2017 1403  AST 22  ALT 11*  ALKPHOS 83  BILITOT 0.4  PROT 7.4  ALBUMIN 3.7   Recent Labs  Lab 10/06/2017 1403  LIPASE 45   No results for input(s): AMMONIA in the last 168 hours. CBC: Recent Labs  Lab 09/26/2017 1403  WBC 7.0  NEUTROABS 5.5  HGB 8.3*  HCT 27.7*  MCV 86.3  PLT 186   Cardiac Enzymes: No results for input(s): CKTOTAL, CKMB, CKMBINDEX, TROPONINI in the last 168 hours. D-Dimer No results for input(s): DDIMER in the last 72 hours. BNP: Invalid input(s): POCBNP CBG: No results for input(s): GLUCAP in the last 168 hours. Anemia work up No results for input(s): VITAMINB12, FOLATE, FERRITIN, TIBC, IRON, RETICCTPCT in the last 72 hours. Urinalysis    Component Value Date/Time   COLORURINE YELLOW 03/27/2015 [place] 03/27/2015 1653   LABSPEC 1.017 03/27/2015 1653   PHURINE 6.0 03/27/2015  1653   GLUCOSEU NEGATIVE 03/27/2015 1653   HGBUR TRACE (A) 03/27/2015 1653   BILIRUBINUR NEGATIVE 03/27/2015 1653   KETONESUR NEGATIVE 03/27/2015 1653   PROTEINUR NEGATIVE 03/27/2015 1653   UROBILINOGEN 0.2 03/27/2015 1653   NITRITE NEGATIVE 03/27/2015 1653   LEUKOCYTESUR NEGATIVE 03/27/2015 1653   Sepsis Labs Invalid input(s): PROCALCITONIN,  WBC,  LACTICIDVEN     SIGNED:  Hosie Poisson, MD  [facility] 10/09/2017, 8:37 AM Pager   If 7PM-7AM, please contact night-coverage [email or web address] Password [email or web address]

## 2017-10-13 DEATH — deceased
